# Patient Record
Sex: Male | Born: 1995 | Race: White | Hispanic: No | Marital: Single | Smoking: Current some day smoker
Health system: Southern US, Community
[De-identification: ages and names within clinical notes are randomized; demographics above are authoritative.]

## PROBLEM LIST (undated history)

## (undated) DIAGNOSIS — F419 Anxiety disorder, unspecified: Secondary | ICD-10-CM

## (undated) DIAGNOSIS — N200 Calculus of kidney: Secondary | ICD-10-CM

## (undated) DIAGNOSIS — K3580 Unspecified acute appendicitis: Secondary | ICD-10-CM

## (undated) HISTORY — DX: Unspecified acute appendicitis: K35.80

## (undated) HISTORY — DX: Calculus of kidney: N20.0

---
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
## 2017-01-01 DIAGNOSIS — J069 Acute upper respiratory infection, unspecified: Secondary | ICD-10-CM | POA: Diagnosis not present

## 2017-01-01 DIAGNOSIS — H6502 Acute serous otitis media, left ear: Secondary | ICD-10-CM | POA: Diagnosis not present

## 2017-02-10 ENCOUNTER — Encounter: Payer: 59 | Admitting: Family Medicine

## 2017-02-10 DIAGNOSIS — Z0289 Encounter for other administrative examinations: Secondary | ICD-10-CM

## 2017-02-19 DIAGNOSIS — H9312 Tinnitus, left ear: Secondary | ICD-10-CM | POA: Diagnosis not present

## 2017-02-19 DIAGNOSIS — Z68.41 Body mass index (BMI) pediatric, less than 5th percentile for age: Secondary | ICD-10-CM | POA: Diagnosis not present

## 2017-02-19 DIAGNOSIS — F418 Other specified anxiety disorders: Secondary | ICD-10-CM | POA: Diagnosis not present

## 2017-03-21 DIAGNOSIS — F418 Other specified anxiety disorders: Secondary | ICD-10-CM | POA: Diagnosis not present

## 2017-06-10 DIAGNOSIS — Z6820 Body mass index (BMI) 20.0-20.9, adult: Secondary | ICD-10-CM | POA: Diagnosis not present

## 2017-06-10 DIAGNOSIS — F418 Other specified anxiety disorders: Secondary | ICD-10-CM | POA: Diagnosis not present

## 2017-08-23 IMAGING — DX DG ABDOMEN 2V
3 series · 4 of 4 positions shown · non-contrast
Comparison: No in PACs

CLINICAL DATA: Right lower quadrant abdominal pain and tenderness
for the past day. History of chronic constipation most recent bowel
movement yesterday.

EXAM:
ABDOMEN - 2 VIEW

[Series 1: abdomen erect · 0.14mm/px · 2 of 2 slices shown]
[im 1/2]
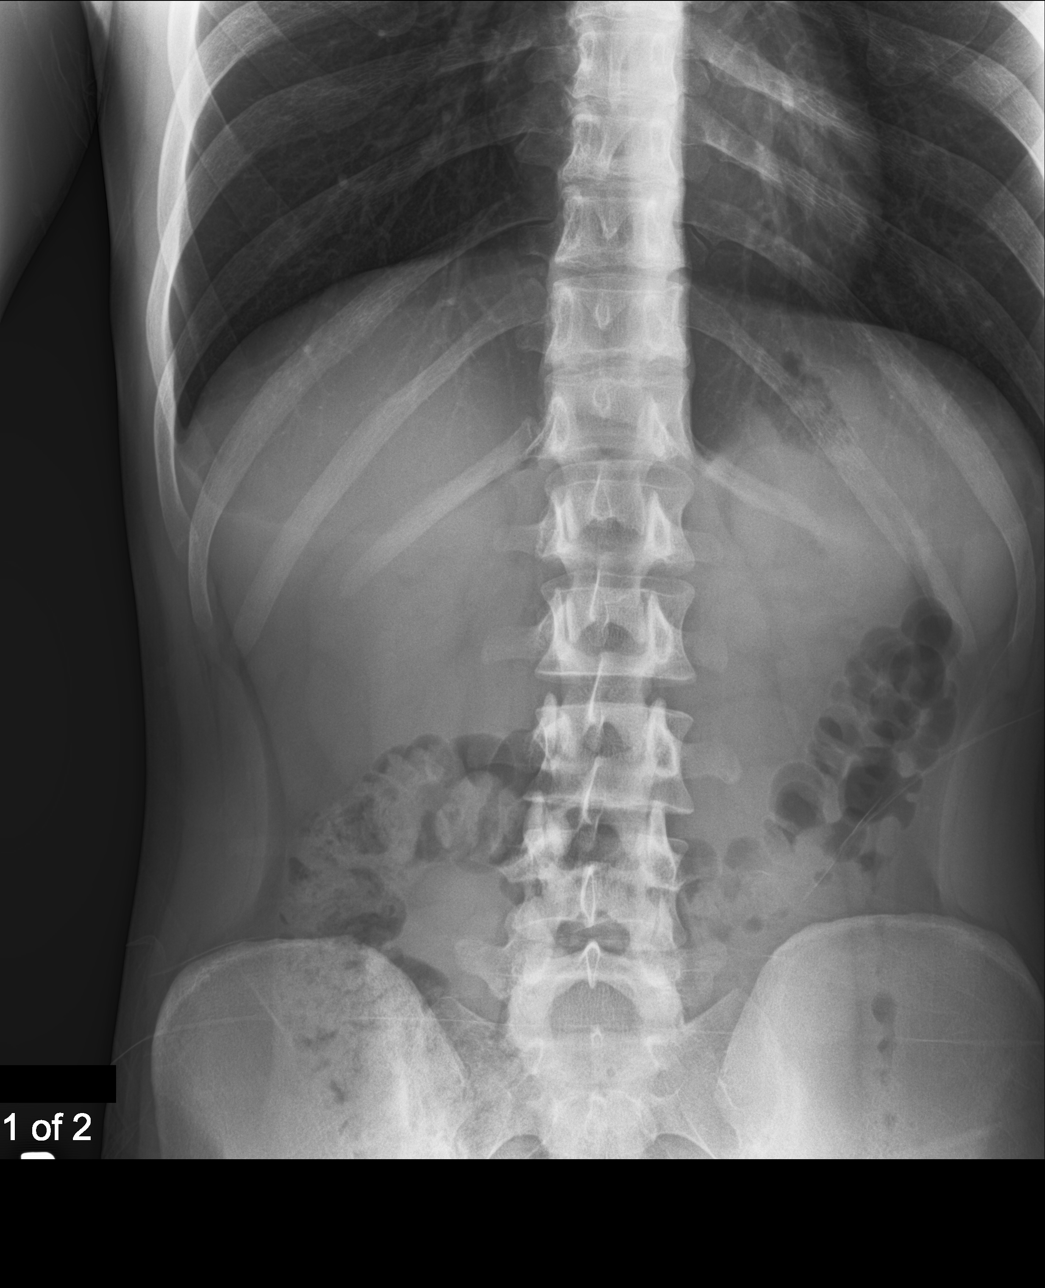
[im 2/2]
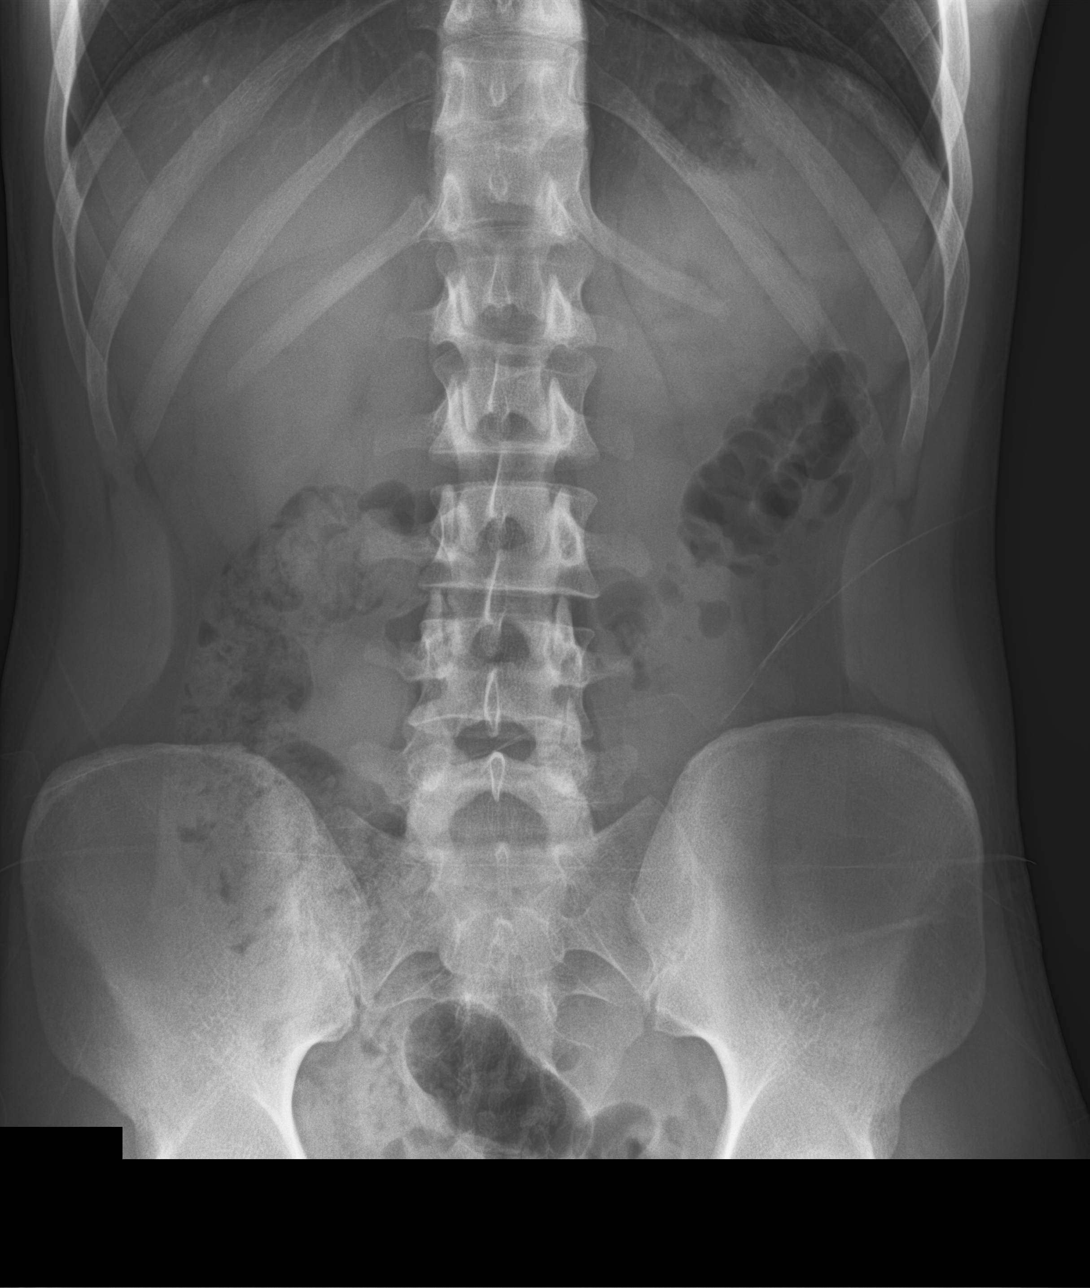

[abdomen supine (1 of 2)]
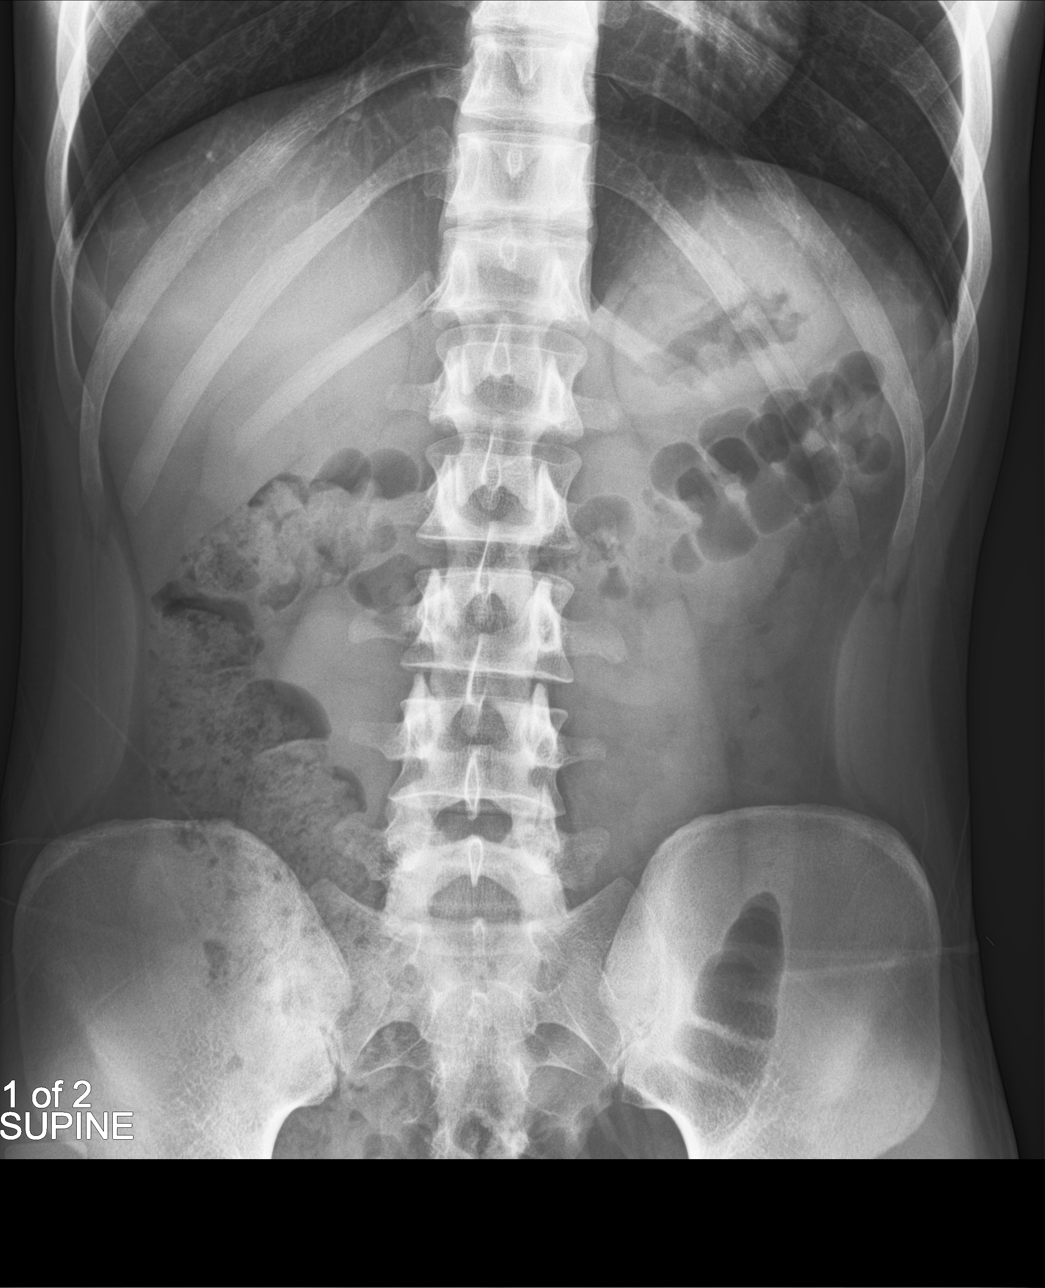

[abdomen supine (2 of 2)]
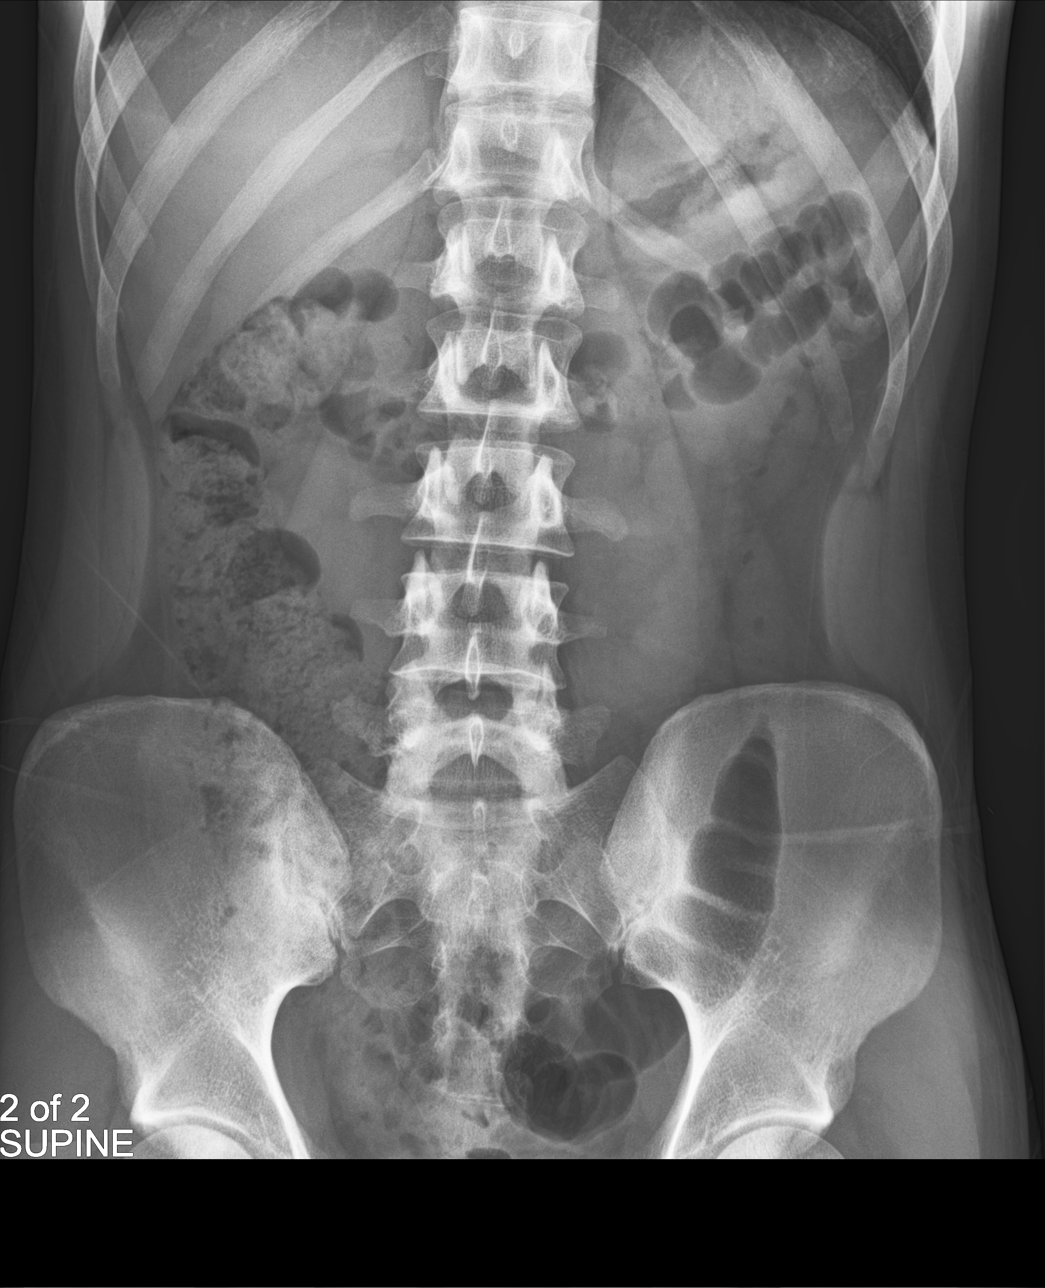

[4 of 4 positions shown; findings below may reference images not displayed]

FINDINGS: The colonic stool burden is mildly increased but less conspicuous
than on the previous study. No small or large bowel obstructive
pattern is observed. No abnormal soft tissue calcifications are
observed. The bony structures are unremarkable.
IMPRESSION: Moderate colonic stool burden may reflect mild constipation in the
appropriate clinical setting. There is no evidence of bowel
obstruction or perforation.

## 2017-09-04 DIAGNOSIS — W57XXXA Bitten or stung by nonvenomous insect and other nonvenomous arthropods, initial encounter: Secondary | ICD-10-CM | POA: Diagnosis not present

## 2017-09-04 DIAGNOSIS — R590 Localized enlarged lymph nodes: Secondary | ICD-10-CM | POA: Diagnosis not present

## 2017-09-23 DIAGNOSIS — Z113 Encounter for screening for infections with a predominantly sexual mode of transmission: Secondary | ICD-10-CM | POA: Diagnosis not present

## 2017-09-23 DIAGNOSIS — Z6820 Body mass index (BMI) 20.0-20.9, adult: Secondary | ICD-10-CM | POA: Diagnosis not present

## 2017-09-23 DIAGNOSIS — Z0000 Encounter for general adult medical examination without abnormal findings: Secondary | ICD-10-CM | POA: Diagnosis not present

## 2017-09-23 DIAGNOSIS — H9312 Tinnitus, left ear: Secondary | ICD-10-CM | POA: Diagnosis not present

## 2017-11-30 ENCOUNTER — Emergency Department (HOSPITAL_COMMUNITY): Payer: 59

## 2017-11-30 ENCOUNTER — Encounter (HOSPITAL_COMMUNITY): Payer: Self-pay | Admitting: Emergency Medicine

## 2017-11-30 ENCOUNTER — Inpatient Hospital Stay (HOSPITAL_COMMUNITY)
Admission: EM | Admit: 2017-11-30 | Discharge: 2017-12-01 | DRG: 343 | Disposition: A | Discharge status: Home / Self Care | Payer: 59 | Attending: General Surgery | Admitting: General Surgery

## 2017-11-30 ENCOUNTER — Other Ambulatory Visit: Payer: Self-pay

## 2017-11-30 DIAGNOSIS — R17 Unspecified jaundice: Secondary | ICD-10-CM | POA: Diagnosis not present

## 2017-11-30 DIAGNOSIS — K353 Acute appendicitis with localized peritonitis, without perforation or gangrene: Secondary | ICD-10-CM | POA: Diagnosis not present

## 2017-11-30 DIAGNOSIS — E876 Hypokalemia: Secondary | ICD-10-CM | POA: Diagnosis not present

## 2017-11-30 DIAGNOSIS — R1030 Lower abdominal pain, unspecified: Secondary | ICD-10-CM | POA: Diagnosis not present

## 2017-11-30 DIAGNOSIS — K3580 Unspecified acute appendicitis: Principal | ICD-10-CM | POA: Diagnosis present

## 2017-11-30 DIAGNOSIS — E869 Volume depletion, unspecified: Secondary | ICD-10-CM | POA: Diagnosis present

## 2017-11-30 DIAGNOSIS — R1031 Right lower quadrant pain: Secondary | ICD-10-CM | POA: Diagnosis not present

## 2017-11-30 LAB — COMPREHENSIVE METABOLIC PANEL
ALBUMIN: 5.1 g/dL — AB (ref 3.5–5.0)
ALT: 19 U/L (ref 0–44)
AST: 28 U/L (ref 15–41)
Alkaline Phosphatase: 70 U/L (ref 38–126)
Anion gap: 8 (ref 5–15)
BUN: 10 mg/dL (ref 6–20)
CHLORIDE: 105 mmol/L (ref 98–111)
CO2: 25 mmol/L (ref 22–32)
CREATININE: 0.94 mg/dL (ref 0.61–1.24)
Calcium: 9.3 mg/dL (ref 8.9–10.3)
GFR calc non Af Amer: >60 mL/min (ref >60)
Glucose, Bld: 82 mg/dL (ref 70–99)
Potassium: 3.4 mmol/L — ABNORMAL LOW (ref 3.5–5.1)
Sodium: 138 mmol/L (ref 135–145)
Total Bilirubin: 1.4 mg/dL — ABNORMAL HIGH (ref 0.3–1.2)
Total Protein: 8.4 g/dL — ABNORMAL HIGH (ref 6.5–8.1)

## 2017-11-30 LAB — URINALYSIS, ROUTINE W REFLEX MICROSCOPIC
Bilirubin Urine: NEGATIVE
Glucose, UA: NEGATIVE mg/dL
Hgb urine dipstick: NEGATIVE
KETONES UR: 5 mg/dL — AB
LEUKOCYTES UA: NEGATIVE
NITRITE: NEGATIVE
Protein, ur: NEGATIVE mg/dL
Specific Gravity, Urine: 1.023 (ref 1.005–1.030)
pH: 6 (ref 5.0–8.0)

## 2017-11-30 LAB — CBC WITH DIFFERENTIAL/PLATELET
ABS IMMATURE GRANULOCYTES: 0.02 10*3/uL (ref 0.00–0.07)
BASOS ABS: 0 10*3/uL (ref 0.0–0.1)
Basophils Relative: 0 %
Eosinophils Absolute: 0 10*3/uL (ref 0.0–0.5)
Eosinophils Relative: 0 %
HCT: 49.5 % (ref 39.0–52.0)
HEMOGLOBIN: 16.9 g/dL (ref 13.0–17.0)
Immature Granulocytes: 0 %
Lymphocytes Relative: 18 %
Lymphs Abs: 1.8 10*3/uL (ref 0.7–4.0)
MCH: 30.8 pg (ref 26.0–34.0)
MCHC: 34.1 g/dL (ref 30.0–36.0)
MCV: 90.3 fL (ref 80.0–100.0)
Monocytes Absolute: 0.6 10*3/uL (ref 0.1–1.0)
Monocytes Relative: 6 %
NEUTROS PCT: 76 %
NRBC: 0 % (ref 0.0–0.2)
Neutro Abs: 7.3 10*3/uL (ref 1.7–7.7)
Platelets: 250 10*3/uL (ref 150–400)
RBC: 5.48 MIL/uL (ref 4.22–5.81)
RDW: 12.6 % (ref 11.5–15.5)
WBC: 9.7 10*3/uL (ref 4.0–10.5)

## 2017-11-30 MED ORDER — SODIUM CHLORIDE 0.9 % IV BOLUS
500.0000 mL | Freq: Once | INTRAVENOUS | Status: AC
  Administered 2017-11-30: 500 mL via INTRAVENOUS

## 2017-11-30 MED ORDER — HYDROMORPHONE HCL 1 MG/ML IJ SOLN
0.5000 mg | Freq: Once | INTRAMUSCULAR | Status: AC
  Administered 2017-11-30: 0.5 mg via INTRAVENOUS
  Filled 2017-11-30: qty 1

## 2017-11-30 MED ORDER — ONDANSETRON HCL 4 MG/2ML IJ SOLN
4.0000 mg | Freq: Once | INTRAMUSCULAR | Status: AC
  Administered 2017-11-30: 4 mg via INTRAVENOUS
  Filled 2017-11-30: qty 2

## 2017-11-30 NOTE — ED Triage Notes (Signed)
Pt started with generalized lower abdominal and back pain on Saturday. Now c/o RLQ abdominal pain only. Denies fevers,nausea,vomiting,and diarrhea.

## 2017-11-30 NOTE — ED Provider Notes (Signed)
[facility] EMERGENCY DEPARTMENT Provider Note   CSN: [id number] Arrival date & time: 11/30/17  2014     History   Chief Complaint Chief Complaint  Patient presents with  . Abdominal Pain    HPI Matthew Mullins is a 22 y.o. male.  Patient complains of right lower quadrant abdominal pain that has been coming increasingly worse over the last couple days no fevers no chills no vomiting  The history is provided by the patient. No language interpreter was used.  Abdominal Pain   This is a new problem. The current episode started 2 days ago. The problem occurs constantly. The problem has not changed since onset.The pain is associated with an unknown factor. The pain is located in the RLQ. The quality of the pain is aching. The pain is at a severity of 5/10. The pain is moderate. Pertinent negatives include diarrhea, flatus, frequency, hematuria and headaches. Nothing aggravates the symptoms.    Past Medical History:  Diagnosis Date  . Nephrolithiasis 11/2016   ER visit referred to urology    Patient Active Problem List   Diagnosis Date Noted  . Constipation 09/12/2016  . Viral pharyngitis 09/12/2016  . Well adolescent visit 06/24/2012    Past Surgical History:  Procedure Laterality Date  . NO PAST SURGERIES          Home Medications    Prior to Admission medications   Not on File    Family History Family History  Problem Relation Age of Onset  . Arrhythmia Paternal Grandmother        prolonged QT  . Hypertension Paternal Grandmother   . Diabetes Paternal Grandmother   . COPD Paternal Grandfather   . CAD Other        great grandmother  . Stroke Other        great grandmother  . Cancer Other        paternal aunt with leukemia and breast    Social History Social History   Tobacco Use  . Smoking status: Never Smoker  . Smokeless tobacco: Never Used  Substance Use Topics  . Alcohol use: No  . Drug use: No     Allergies   Patient has no known  allergies.   Review of Systems Review of Systems  Constitutional: Negative for appetite change and fatigue.  HENT: Negative for congestion, ear discharge and sinus pressure.   Eyes: Negative for discharge.  Respiratory: Negative for cough.   Cardiovascular: Negative for chest pain.  Gastrointestinal: Positive for abdominal pain. Negative for diarrhea and flatus.  Genitourinary: Negative for frequency and hematuria.  Musculoskeletal: Negative for back pain.  Skin: Negative for rash.  Neurological: Negative for seizures and headaches.  Psychiatric/Behavioral: Negative for hallucinations.     Physical Exam Updated Vital Signs BP 111/67   Pulse (!) 52   Temp 98.1 F (36.7 C) (Oral)   Resp 16   Ht 5\' 5"  (1.651 m)   Wt 56.7 kg   SpO2 97%   BMI 20.80 kg/m   Physical Exam  Constitutional: He is oriented to person, place, and time. He appears well-developed.  HENT:  Head: Normocephalic.  Eyes: Conjunctivae and EOM are normal. No scleral icterus.  Neck: Neck supple. No thyromegaly present.  Cardiovascular: Normal rate and regular rhythm. Exam reveals no gallop and no friction rub.  No murmur heard. Pulmonary/Chest: No stridor. He has no wheezes. He has no rales. He exhibits no tenderness.  Abdominal: He exhibits no distension. There is tenderness. There  is no rebound.  Tender right lower quadrant.  No rebound  Musculoskeletal: Normal range of motion. He exhibits no edema.  Lymphadenopathy:    He has no cervical adenopathy.  Neurological: He is oriented to person, place, and time. He exhibits normal muscle tone. Coordination normal.  Skin: No rash noted. No erythema.  Psychiatric: He has a normal mood and affect. His behavior is normal.     ED Treatments / Results  Labs (all labs ordered are listed, but only abnormal results are displayed) Labs Reviewed  URINALYSIS, ROUTINE W REFLEX MICROSCOPIC - Abnormal; Notable for the following components:      Result Value    Ketones, ur 5 (*)    All other components within normal limits  COMPREHENSIVE METABOLIC PANEL - Abnormal; Notable for the following components:   Potassium 3.4 (*)    Total Protein 8.4 (*)    Albumin 5.1 (*)    Total Bilirubin 1.4 (*)    All other components within normal limits  CBC WITH DIFFERENTIAL/PLATELET    EKG None  Radiology No results found.  Procedures Procedures (including critical care time)  Medications Ordered in ED Medications  sodium chloride 0.9 % bolus 500 mL (0 mLs Intravenous Stopped 11/30/17 2334)  ondansetron (ZOFRAN) injection 4 mg (4 mg Intravenous Given 11/30/17 2241)  HYDROmorphone (DILAUDID) injection 0.5 mg (0.5 mg Intravenous Given 11/30/17 2241)     Initial Impression / Assessment and Plan / ED Course  I have reviewed the triage vital signs and the nursing notes.  Pertinent labs & imaging results that were available during my care of the patient were reviewed by me and considered in my medical decision making (see chart for details).     Labs unremarkable.  Patient with right lower quadrant abdominal pain.  He will get a CT scan of the abdomen to rule out appendicitis  Final Clinical Impressions(s) / ED Diagnoses   Final diagnoses:  None    ED Discharge Orders    None       Bethann BerkshireZammit, Kariana Wiles, MD 11/30/17 2342

## 2017-12-01 ENCOUNTER — Encounter (HOSPITAL_COMMUNITY): Payer: Self-pay | Admitting: Internal Medicine

## 2017-12-01 ENCOUNTER — Inpatient Hospital Stay (HOSPITAL_COMMUNITY): Payer: 59 | Admitting: Anesthesiology

## 2017-12-01 ENCOUNTER — Encounter (HOSPITAL_COMMUNITY)
Admission: EM | Disposition: A | Discharge status: Home / Self Care | Payer: Self-pay | Source: Home / Self Care | Attending: General Surgery

## 2017-12-01 DIAGNOSIS — E876 Hypokalemia: Secondary | ICD-10-CM | POA: Diagnosis present

## 2017-12-01 DIAGNOSIS — K353 Acute appendicitis with localized peritonitis, without perforation or gangrene: Secondary | ICD-10-CM

## 2017-12-01 DIAGNOSIS — K3580 Unspecified acute appendicitis: Secondary | ICD-10-CM | POA: Diagnosis not present

## 2017-12-01 DIAGNOSIS — R1030 Lower abdominal pain, unspecified: Secondary | ICD-10-CM | POA: Diagnosis present

## 2017-12-01 DIAGNOSIS — R17 Unspecified jaundice: Secondary | ICD-10-CM | POA: Diagnosis present

## 2017-12-01 DIAGNOSIS — E869 Volume depletion, unspecified: Secondary | ICD-10-CM | POA: Diagnosis present

## 2017-12-01 HISTORY — PX: LAPAROSCOPIC APPENDECTOMY: SHX408

## 2017-12-01 HISTORY — DX: Unspecified acute appendicitis: K35.80

## 2017-12-01 LAB — COMPREHENSIVE METABOLIC PANEL
ALBUMIN: 4.3 g/dL (ref 3.5–5.0)
ALK PHOS: 60 U/L (ref 38–126)
ALT: 16 U/L (ref 0–44)
ANION GAP: 6 (ref 5–15)
AST: 21 U/L (ref 15–41)
BUN: 10 mg/dL (ref 6–20)
CALCIUM: 8.9 mg/dL (ref 8.9–10.3)
CHLORIDE: 109 mmol/L (ref 98–111)
CO2: 25 mmol/L (ref 22–32)
Creatinine, Ser: 1.03 mg/dL (ref 0.61–1.24)
GFR calc non Af Amer: >60 mL/min (ref >60)
Glucose, Bld: 82 mg/dL (ref 70–99)
POTASSIUM: 4.1 mmol/L (ref 3.5–5.1)
SODIUM: 140 mmol/L (ref 135–145)
Total Bilirubin: 1.5 mg/dL — ABNORMAL HIGH (ref 0.3–1.2)
Total Protein: 7.3 g/dL (ref 6.5–8.1)

## 2017-12-01 LAB — BILIRUBIN, FRACTIONATED(TOT/DIR/INDIR)
Bilirubin, Direct: 0.2 mg/dL (ref 0.0–0.2)
Indirect Bilirubin: 1.4 mg/dL — ABNORMAL HIGH (ref 0.3–0.9)
Total Bilirubin: 1.6 mg/dL — ABNORMAL HIGH (ref 0.3–1.2)

## 2017-12-01 LAB — CBC
HEMATOCRIT: 44.8 % (ref 39.0–52.0)
HEMOGLOBIN: 15.1 g/dL (ref 13.0–17.0)
MCH: 31.2 pg (ref 26.0–34.0)
MCHC: 33.7 g/dL (ref 30.0–36.0)
MCV: 92.6 fL (ref 80.0–100.0)
NRBC: 0 % (ref 0.0–0.2)
PLATELETS: 232 10*3/uL (ref 150–400)
RBC: 4.84 MIL/uL (ref 4.22–5.81)
RDW: 12.8 % (ref 11.5–15.5)
WBC: 8.4 10*3/uL (ref 4.0–10.5)

## 2017-12-01 LAB — MAGNESIUM: MAGNESIUM: 2 mg/dL (ref 1.7–2.4)

## 2017-12-01 LAB — PHOSPHORUS: Phosphorus: 3.8 mg/dL (ref 2.5–4.6)

## 2017-12-01 SURGERY — APPENDECTOMY, LAPAROSCOPIC
Anesthesia: General

## 2017-12-01 MED ORDER — POVIDONE-IODINE 10 % EX OINT
TOPICAL_OINTMENT | CUTANEOUS | Status: AC
  Filled 2017-12-01: qty 1

## 2017-12-01 MED ORDER — ACETAMINOPHEN 325 MG PO TABS: 650.0000 mg | ORAL_TABLET | Freq: Four times a day (QID) | ORAL | Status: DC | PRN

## 2017-12-01 MED ORDER — ACETAMINOPHEN 650 MG RE SUPP: 650.0000 mg | Freq: Four times a day (QID) | RECTAL | Status: DC | PRN

## 2017-12-01 MED ORDER — FENTANYL CITRATE (PF) 100 MCG/2ML IJ SOLN
INTRAMUSCULAR | Status: DC | PRN
  Administered 2017-12-01: 50 ug via INTRAVENOUS
  Administered 2017-12-01: 100 ug via INTRAVENOUS

## 2017-12-01 MED ORDER — HEMOSTATIC AGENTS (NO CHARGE) OPTIME
TOPICAL | Status: DC | PRN
  Administered 2017-12-01: 1 via TOPICAL

## 2017-12-01 MED ORDER — FENTANYL CITRATE (PF) 250 MCG/5ML IJ SOLN
INTRAMUSCULAR | Status: AC
  Filled 2017-12-01: qty 5

## 2017-12-01 MED ORDER — KETOROLAC TROMETHAMINE 30 MG/ML IJ SOLN
30.0000 mg | Freq: Once | INTRAMUSCULAR | Status: AC
  Administered 2017-12-01: 30 mg via INTRAVENOUS
  Filled 2017-12-01: qty 1

## 2017-12-01 MED ORDER — POVIDONE-IODINE 10 % OINT PACKET
TOPICAL_OINTMENT | CUTANEOUS | Status: DC | PRN
  Administered 2017-12-01: 1 via TOPICAL

## 2017-12-01 MED ORDER — PIPERACILLIN-TAZOBACTAM 3.375 G IVPB 30 MIN
3.3750 g | Freq: Once | INTRAVENOUS | Status: AC
  Administered 2017-12-01: 3.375 g via INTRAVENOUS
  Filled 2017-12-01: qty 50

## 2017-12-01 MED ORDER — SODIUM CHLORIDE 0.9 % IR SOLN
Status: DC | PRN
  Administered 2017-12-01: 1000 mL

## 2017-12-01 MED ORDER — ONDANSETRON HCL 4 MG/2ML IJ SOLN
INTRAMUSCULAR | Status: AC
  Filled 2017-12-01: qty 2

## 2017-12-01 MED ORDER — BUPIVACAINE LIPOSOME 1.3 % IJ SUSP
INTRAMUSCULAR | Status: DC | PRN
  Administered 2017-12-01: 20 mL

## 2017-12-01 MED ORDER — PROPOFOL 10 MG/ML IV BOLUS
INTRAVENOUS | Status: DC | PRN
  Administered 2017-12-01: 150 mg via INTRAVENOUS
  Administered 2017-12-01: 50 mg via INTRAVENOUS

## 2017-12-01 MED ORDER — MEPERIDINE HCL 50 MG/ML IJ SOLN: 6.2500 mg | INTRAMUSCULAR | Status: DC | PRN

## 2017-12-01 MED ORDER — ROCURONIUM BROMIDE 10 MG/ML (PF) SYRINGE
PREFILLED_SYRINGE | INTRAVENOUS | Status: AC
  Filled 2017-12-01: qty 10

## 2017-12-01 MED ORDER — KETOROLAC TROMETHAMINE 30 MG/ML IJ SOLN: 30.0000 mg | Freq: Once | INTRAMUSCULAR | Status: DC

## 2017-12-01 MED ORDER — ONDANSETRON HCL 4 MG PO TABS: 4.0000 mg | ORAL_TABLET | Freq: Four times a day (QID) | ORAL | Status: DC | PRN

## 2017-12-01 MED ORDER — ONDANSETRON HCL 4 MG/2ML IJ SOLN: 4.0000 mg | Freq: Once | INTRAMUSCULAR | Status: DC | PRN

## 2017-12-01 MED ORDER — FAMOTIDINE IN NACL 20-0.9 MG/50ML-% IV SOLN
20.0000 mg | Freq: Two times a day (BID) | INTRAVENOUS | Status: DC
  Administered 2017-12-01: 20 mg via INTRAVENOUS
  Filled 2017-12-01: qty 50

## 2017-12-01 MED ORDER — HYDROCODONE-ACETAMINOPHEN 7.5-325 MG PO TABS: 1.0000 | ORAL_TABLET | Freq: Once | ORAL | Status: DC | PRN

## 2017-12-01 MED ORDER — HYDROCODONE-ACETAMINOPHEN 5-325 MG PO TABS: 1.0000 | ORAL_TABLET | ORAL | 0 refills | Status: DC | PRN

## 2017-12-01 MED ORDER — HYDROMORPHONE HCL 1 MG/ML IJ SOLN: 1.0000 mg | INTRAMUSCULAR | Status: DC | PRN

## 2017-12-01 MED ORDER — SODIUM CHLORIDE 0.9 % IV SOLN: 100.0000 mg/kg | Freq: Three times a day (TID) | INTRAVENOUS | Status: DC

## 2017-12-01 MED ORDER — LACTATED RINGERS IV SOLN
INTRAVENOUS | Status: DC
  Administered 2017-12-01 (×2): via INTRAVENOUS

## 2017-12-01 MED ORDER — ONDANSETRON HCL 4 MG/2ML IJ SOLN
4.0000 mg | Freq: Four times a day (QID) | INTRAMUSCULAR | Status: DC | PRN
  Administered 2017-12-01: 4 mg via INTRAVENOUS

## 2017-12-01 MED ORDER — BUPIVACAINE LIPOSOME 1.3 % IJ SUSP
INTRAMUSCULAR | Status: AC
  Filled 2017-12-01: qty 20

## 2017-12-01 MED ORDER — SODIUM CHLORIDE 0.9 % IV SOLN: Freq: Once | INTRAVENOUS | Status: DC

## 2017-12-01 MED ORDER — CHLORHEXIDINE GLUCONATE CLOTH 2 % EX PADS: 6.0000 | MEDICATED_PAD | Freq: Once | CUTANEOUS | Status: DC

## 2017-12-01 MED ORDER — KETOROLAC TROMETHAMINE 30 MG/ML IJ SOLN: 30.0000 mg | Freq: Once | INTRAMUSCULAR | Status: DC | PRN

## 2017-12-01 MED ORDER — SUGAMMADEX SODIUM 500 MG/5ML IV SOLN
INTRAVENOUS | Status: AC
  Filled 2017-12-01: qty 5

## 2017-12-01 MED ORDER — LACTATED RINGERS IV BOLUS: 1000.0000 mL | Freq: Once | INTRAVENOUS | Status: DC

## 2017-12-01 MED ORDER — HYDROMORPHONE HCL 1 MG/ML IJ SOLN: 0.2500 mg | INTRAMUSCULAR | Status: DC | PRN

## 2017-12-01 MED ORDER — PIPERACILLIN-TAZOBACTAM 3.375 G IVPB 30 MIN
3.3750 g | Freq: Once | INTRAVENOUS | Status: DC
  Filled 2017-12-01: qty 50

## 2017-12-01 MED ORDER — SUGAMMADEX SODIUM 200 MG/2ML IV SOLN
INTRAVENOUS | Status: DC | PRN
  Administered 2017-12-01: 200 mg via INTRAVENOUS

## 2017-12-01 MED ORDER — ROCURONIUM 10MG/ML (10ML) SYRINGE FOR MEDFUSION PUMP - OPTIME
INTRAVENOUS | Status: DC | PRN
  Administered 2017-12-01: 40 mg via INTRAVENOUS

## 2017-12-01 MED ORDER — MIDAZOLAM HCL 5 MG/5ML IJ SOLN
INTRAMUSCULAR | Status: DC | PRN
  Administered 2017-12-01: 2 mg via INTRAVENOUS

## 2017-12-01 MED ORDER — POTASSIUM CHLORIDE IN NACL 20-0.9 MEQ/L-% IV SOLN
INTRAVENOUS | Status: AC
  Administered 2017-12-01: 02:00:00 via INTRAVENOUS
  Filled 2017-12-01: qty 1000

## 2017-12-01 MED ORDER — PIPERACILLIN-TAZOBACTAM 3.375 G IVPB: 3.3750 g | Freq: Three times a day (TID) | INTRAVENOUS | Status: DC

## 2017-12-01 MED ORDER — EPHEDRINE 5 MG/ML INJ
INTRAVENOUS | Status: AC
  Filled 2017-12-01: qty 10

## 2017-12-01 MED ORDER — POTASSIUM CHLORIDE IN NACL 20-0.9 MEQ/L-% IV SOLN
INTRAVENOUS | Status: DC
  Administered 2017-12-01: 06:00:00 via INTRAVENOUS
  Filled 2017-12-01: qty 1000

## 2017-12-01 MED ORDER — IOPAMIDOL (ISOVUE-300) INJECTION 61%
100.0000 mL | Freq: Once | INTRAVENOUS | Status: AC | PRN
  Administered 2017-12-01: 100 mL via INTRAVENOUS

## 2017-12-01 MED ORDER — LIDOCAINE 2% (20 MG/ML) 5 ML SYRINGE
INTRAMUSCULAR | Status: AC
  Filled 2017-12-01: qty 5

## 2017-12-01 MED ORDER — MIDAZOLAM HCL 2 MG/2ML IJ SOLN
INTRAMUSCULAR | Status: AC
  Filled 2017-12-01: qty 2

## 2017-12-01 SURGICAL SUPPLY — 50 items
APPLICATOR ARISTA FLEXITIP XL (MISCELLANEOUS) ×3 IMPLANT
BAG RETRIEVAL 10 (BASKET) ×1
BAG RETRIEVAL 10MM (BASKET) ×1
CHLORAPREP W/TINT 26ML (MISCELLANEOUS) ×3 IMPLANT
CLOTH BEACON ORANGE TIMEOUT ST (SAFETY) ×3 IMPLANT
COVER LIGHT HANDLE STERIS (MISCELLANEOUS) ×6 IMPLANT
CUTTER FLEX LINEAR 45M (STAPLE) ×3 IMPLANT
DECANTER SPIKE VIAL GLASS SM (MISCELLANEOUS) ×3 IMPLANT
ELECT REM PT RETURN 9FT ADLT (ELECTROSURGICAL) ×3
ELECTRODE REM PT RTRN 9FT ADLT (ELECTROSURGICAL) ×1 IMPLANT
EVACUATOR SMOKE 8.L (FILTER) ×3 IMPLANT
GLOVE BIOGEL PI IND STRL 6.5 (GLOVE) ×1 IMPLANT
GLOVE BIOGEL PI IND STRL 7.0 (GLOVE) ×2 IMPLANT
GLOVE BIOGEL PI INDICATOR 6.5 (GLOVE) ×2
GLOVE BIOGEL PI INDICATOR 7.0 (GLOVE) ×4
GLOVE SURG SS PI 6.5 STRL IVOR (GLOVE) ×3 IMPLANT
GLOVE SURG SS PI 7.5 STRL IVOR (GLOVE) ×3 IMPLANT
GOWN STRL REUS W/ TWL XL LVL3 (GOWN DISPOSABLE) ×1 IMPLANT
GOWN STRL REUS W/TWL LRG LVL3 (GOWN DISPOSABLE) ×3 IMPLANT
GOWN STRL REUS W/TWL XL LVL3 (GOWN DISPOSABLE) ×2
HEMOSTAT ARISTA ABSORB 1G (MISCELLANEOUS) ×3 IMPLANT
INST SET LAPROSCOPIC AP (KITS) ×3 IMPLANT
KIT TURNOVER KIT A (KITS) ×3 IMPLANT
MANIFOLD NEPTUNE II (INSTRUMENTS) ×3 IMPLANT
NEEDLE HYPO 18GX1.5 BLUNT FILL (NEEDLE) ×3 IMPLANT
NEEDLE HYPO 22GX1.5 SAFETY (NEEDLE) ×3 IMPLANT
NEEDLE INSUFFLATION 14GA 120MM (NEEDLE) ×3 IMPLANT
NS IRRIG 1000ML POUR BTL (IV SOLUTION) ×3 IMPLANT
PACK LAP CHOLE LZT030E (CUSTOM PROCEDURE TRAY) ×3 IMPLANT
PAD ARMBOARD 7.5X6 YLW CONV (MISCELLANEOUS) ×3 IMPLANT
PENCIL HANDSWITCHING (ELECTRODE) ×3 IMPLANT
RELOAD 45 VASCULAR/THIN (ENDOMECHANICALS) ×3 IMPLANT
SET BASIN LINEN APH (SET/KITS/TRAYS/PACK) ×3 IMPLANT
SHEARS HARMONIC ACE PLUS 36CM (ENDOMECHANICALS) ×3 IMPLANT
SPONGE GAUZE 2X2 8PLY STER LF (GAUZE/BANDAGES/DRESSINGS) ×3
SPONGE GAUZE 2X2 8PLY STRL LF (GAUZE/BANDAGES/DRESSINGS) ×6 IMPLANT
STAPLER VISISTAT (STAPLE) ×3 IMPLANT
SUT VICRYL 0 UR6 27IN ABS (SUTURE) ×3 IMPLANT
SYR 20CC LL (SYRINGE) ×6 IMPLANT
SYR 30ML LL (SYRINGE) ×3 IMPLANT
SYS BAG RETRIEVAL 10MM (BASKET) ×1
SYSTEM BAG RETRIEVAL 10MM (BASKET) ×1 IMPLANT
TRAY FOLEY W/BAG SLVR 16FR (SET/KITS/TRAYS/PACK) ×2
TRAY FOLEY W/BAG SLVR 16FR ST (SET/KITS/TRAYS/PACK) ×1 IMPLANT
TROCAR ENDO BLADELESS 11MM (ENDOMECHANICALS) ×3 IMPLANT
TROCAR ENDO BLADELESS 12MM (ENDOMECHANICALS) ×3 IMPLANT
TROCAR XCEL NON-BLD 5MMX100MML (ENDOMECHANICALS) ×3 IMPLANT
TUBING INSUFFLATION (TUBING) ×3 IMPLANT
WARMER LAPAROSCOPE (MISCELLANEOUS) ×3 IMPLANT
YANKAUER SUCT 12FT TUBE ARGYLE (SUCTIONS) ×3 IMPLANT

## 2017-12-01 NOTE — ED Notes (Signed)
Zosyn and 0.9% NaCl with Kcl 20 Meq/L will be started after Pepcid is complete as they are not compatible.

## 2017-12-01 NOTE — Discharge Summary (Signed)
Physician Discharge Summary  Patient ID: Matthew Mullins MRN: [id number] DOB/AGE: April 13, 1995 22 y.o.  Admit date: 11/30/2017 Discharge date: 12/01/2017  Admission Diagnoses: Acute appendicitis  Discharge Diagnoses: Same Principal Problem:   Acute appendicitis Active Problems:   Hypokalemia   Elevated bilirubin   Discharged Condition: good  Hospital Course: Patient is a 22 year old white male who presented with a 24-hour history of worsening right lower quadrant abdominal pain.  CT scan of the abdomen revealed acute appendicitis.  He underwent laparoscopic appendectomy on 12/01/2017.  He tolerated the procedure well.  His postoperative course was unremarkable.  He was discharged home from the PACU in good and stable condition.  Treatments: surgery: Laparoscopic appendectomy on 12/01/2017  Discharge Exam: Blood pressure 116/61, pulse 64, temperature 97.9 F (36.6 C), temperature source Oral, resp. rate 16, height 5\' 5"  (1.651 m), weight 56.7 kg, SpO2 100 %. General appearance: alert, cooperative and no distress Resp: clear to auscultation bilaterally Cardio: regular rate and rhythm, S1, S2 normal, no murmur, click, rub or gallop GI: Soft, dressing is dry and intact.  Disposition: Discharge disposition: 01-Home or Self Care       Discharge Instructions    Diet general   Complete by:  As directed    Increase activity slowly   Complete by:  As directed      Allergies as of 12/01/2017   No Known Allergies     Medication List    TAKE these medications   HYDROcodone-acetaminophen 5-325 MG tablet Commonly known as:  NORCO/VICODIN Take 1 tablet by mouth every 4 (four) hours as needed for moderate pain.      Follow-up Information    Franky MachoJenkins, Uday Jantz, MD. Schedule an appointment as soon as possible for a visit on 12/09/2017.   Specialty:  General Surgery Contact information: [address] [place] [id number] [phone]           Signed: Franky MachoMark  Mcguire Gasparyan 12/01/2017, 3:01 PM

## 2017-12-01 NOTE — H&P (Signed)
History and Physical    Matthew Mullins ZOX:[id number] DOB: 1995-10-03 DOA: 11/30/2017  PCP: [facility]   Patient coming from: Home.  I have personally briefly reviewed patient's old medical records in [facility]  Chief Complaint: Abdominal pain.  HPI: Matthew Mullins is a 22 y.o. male with medical history significant of nephrolithiasis who is coming to the emergency department with abdominal pain since about midnight from Saturday to yesterday Sunday, 11/30/2017.  He does not remember exactly what he ate last.  He denies fever, chills, nausea, emesis, diarrhea or constipation.  No dysuria, frequency or hematuria.  He denies sore throat, dyspnea, wheezing, hemoptysis, chest pain, palpitations, diaphoresis, PND, orthopnea or pitting edema of the lower extremities.  No polyuria, polydipsia, polyphagia or blurred vision.  No heat or cold intolerance.  ED Course: Initial vital signs temperature 98.1 F, pulse 84, respirations 17, blood pressure 131/87 mmHg and O2 sat 100% on room air.  The patient was given IV fluids, analgesics and Zosyn in the emergency department.  Dr. Rhunette Croft spoke to Dr. Lovell Sheehan who will be seeing the patient later today.  Urinalysis is normal.  CBC shows a normal white count of 9.7, hemoglobin 16.9 g/dL and platelets 811.  CMP shows a potassium of 3.4 mmol/L.  The rest of the electrolytes are within normal limits.  Renal function is normal.  Total protein is 8.4 and albumin 5.1 g/dL and total bilirubin is 1.4 mg/dL.  Alkaline phosphatase and transaminases are normal.  Magnesium and phosphorus are normal.  Imaging: CT abdomen/pelvis with contrast showed acute appendicitis without extraluminal gas or abscess.  There is appendiceal wall discontinuity in the mid to distal portion that may represent early appendiceal rupture.  See images and full radiology report for further detail.  Review of Systems: As per HPI otherwise 10 point review of systems  negative.   Past Medical History:  Diagnosis Date  . Nephrolithiasis 11/2016   ER visit referred to urology    Past Surgical History:  Procedure Laterality Date  . NO PAST SURGERIES       reports that he has never smoked. He has never used smokeless tobacco. He reports that he does not drink alcohol or use drugs.  No Known Allergies  Family History  Problem Relation Age of Onset  . Arrhythmia Paternal Grandmother        prolonged QT  . Hypertension Paternal Grandmother   . Diabetes Paternal Grandmother   . COPD Paternal Grandfather   . CAD Other        great grandmother  . Stroke Other        great grandmother  . Cancer Other        paternal aunt with leukemia and breast   Prior to Admission medications   Not on File    Physical Exam: Vitals:   12/01/17 0054 12/01/17 0100 12/01/17 0130 12/01/17 0230  BP: (!) 99/51 119/60 (!) 114/52 (!) 104/52  Pulse: 79 62 62 (!) 50  Resp: 16 17 17 18   Temp:      TempSrc:      SpO2: 98% 100% 100% 97%  Weight:      Height:        Constitutional: NAD, calm, comfortable Eyes: PERRL, lids and conjunctivae normal ENMT: Mucous membranes are mildly dry. Posterior pharynx clear of any exudate or lesions. Neck: normal, supple, no masses, no thyromegaly Respiratory: clear to auscultation bilaterally, no wheezing, no crackles. Normal respiratory effort. No accessory muscle  use.  Cardiovascular: Regular rate and rhythm, no murmurs / rubs / gallops. No extremity edema. 2+ pedal pulses. No carotid bruits.  Abdomen: Soft, positive RLQ tenderness, mild guarding, no rebound, no masses palpated. No hepatosplenomegaly. Bowel sounds positive.  Musculoskeletal: no clubbing / cyanosis. Good ROM, no contractures. Normal muscle tone.  Skin: no rashes, lesions, ulcers limited dermatological examination. Neurologic: CN 2-12 grossly intact. Sensation intact, DTR normal. Strength 5/5 in all 4.  Psychiatric: Normal judgment and insight. Alert and  oriented x 4.  Normal mood.   Labs on Admission: I have personally reviewed following labs and imaging studies  CBC: Recent Labs  Lab 11/30/17 2237  WBC 9.7  NEUTROABS 7.3  HGB 16.9  HCT 49.5  MCV 90.3  PLT 250   Basic Metabolic Panel: Recent Labs  Lab 11/30/17 2237  NA 138  K 3.4*  CL 105  CO2 25  GLUCOSE 82  BUN 10  CREATININE 0.94  CALCIUM 9.3  MG 2.0  PHOS 3.8   GFR: Estimated Creatinine Clearance: 100.5 mL/min (by C-G formula based on SCr of 0.94 mg/dL). Liver Function Tests: Recent Labs  Lab 11/30/17 2237  AST 28  ALT 19  ALKPHOS 70  BILITOT 1.4*  PROT 8.4*  ALBUMIN 5.1*   No results for input(s): LIPASE, AMYLASE in the last 168 hours. No results for input(s): AMMONIA in the last 168 hours. Coagulation Profile: No results for input(s): INR, PROTIME in the last 168 hours. Cardiac Enzymes: No results for input(s): CKTOTAL, CKMB, CKMBINDEX, TROPONINI in the last 168 hours. BNP (last 3 results) No results for input(s): PROBNP in the last 8760 hours. HbA1C: No results for input(s): HGBA1C in the last 72 hours. CBG: No results for input(s): GLUCAP in the last 168 hours. Lipid Profile: No results for input(s): CHOL, HDL, LDLCALC, TRIG, CHOLHDL, LDLDIRECT in the last 72 hours. Thyroid Function Tests: No results for input(s): TSH, T4TOTAL, FREET4, T3FREE, THYROIDAB in the last 72 hours. Anemia Panel: No results for input(s): VITAMINB12, FOLATE, FERRITIN, TIBC, IRON, RETICCTPCT in the last 72 hours. Urine analysis:    Component Value Date/Time   COLORURINE YELLOW 11/30/2017 2110   APPEARANCEUR CLEAR 11/30/2017 2110   LABSPEC 1.023 11/30/2017 2110   PHURINE 6.0 11/30/2017 2110   GLUCOSEU NEGATIVE 11/30/2017 2110   HGBUR NEGATIVE 11/30/2017 2110   BILIRUBINUR NEGATIVE 11/30/2017 2110   KETONESUR 5 (A) 11/30/2017 2110   PROTEINUR NEGATIVE 11/30/2017 2110   NITRITE NEGATIVE 11/30/2017 2110   LEUKOCYTESUR NEGATIVE 11/30/2017 2110    Radiological  Exams on Admission: Ct Abdomen Pelvis W Contrast  Result Date: 12/01/2017 CLINICAL DATA:  Abdominal pain and distension. Right lower quadrant pain. EXAM: CT ABDOMEN AND PELVIS WITH CONTRAST TECHNIQUE: Multidetector CT imaging of the abdomen and pelvis was performed using the standard protocol following bolus administration of intravenous contrast. CONTRAST:  ISOVUE-300 IOPAMIDOL (ISOVUE-300) INJECTION 61% COMPARISON:  CT 12/04/2016 FINDINGS: Lower chest: The lung bases are clear. Hepatobiliary: No focal liver abnormality is seen. No gallstones, gallbladder wall thickening, or biliary dilatation. Pancreas: Normal.  No ductal dilatation or inflammation. Spleen: Normal in size without focal abnormality. Adrenals/Urinary Tract: Normal adrenal glands. No hydronephrosis or perinephric edema. Homogeneous renal enhancement. Urinary bladder is physiologically distended without wall thickening. Stomach/Bowel: Acute appendicitis as described below. The stomach is nondistended. No small bowel dilatation, inflammation or obstruction. Small to moderate colonic stool burden without colonic inflammation. Appendix: Location: Retrocecal Diameter: 11 mm Appendicolith: Possibly, increased density at the base of the appendix (image 25  series 5) may be inspissated contents or noncalcified appendicolith. Mucosal hyper-enhancement: Yes, there is appendiceal wall discontinuity involving the medial aspect of mid distal portion portion image 50 series 2 that may represent early rupture but no periappendiceal fluid collection. Extraluminal gas: No Periappendiceal collection: No. Vascular/Lymphatic: No significant vascular findings are present. No enlarged abdominal or pelvic lymph nodes. Reproductive: Prostate is unremarkable. Other: Small amount of free fluid in the pelvis is likely reactive, no organized fluid collection. No free air. Musculoskeletal: Few scattered Schmorl's nodes in the lower thoracic and lumbar spine. There are  no acute or suspicious osseous abnormalities. IMPRESSION: Acute appendicitis without extraluminal gas or abscess. There is appendiceal wall discontinuity in the mid distal portion that may represent early appendiceal rupture. Electronically Signed   By: Narda RutherfordMelanie  Sanford M.D.   On: 12/01/2017 00:46    EKG: Independently reviewed.    Assessment/Plan Principal Problem:   Acute appendicitis Admit to MedSurg/inpatient. Keep n.p.o. Continue IV fluids. Analgesics as needed. Antiemetics as needed. Continue Zosyn 3.375 g every 8 hours IVPB. General surgery to evaluate.  Active Problems:   Hypokalemia Replacing through IV fluids. Follow-up potassium level.    Elevated bilirubin Please due to decreased oral intake and volume depletion. Follow-up bilirubin level.   DVT prophylaxis: SCDs. Code Status: Full code. Family Communication: Disposition Plan: Made for IV antibiotic therapy, symptoms management, general surgery evaluation.  Will likely need surgery for acute appendicitis. Consults called: Franky MachoMark Jenkins, MD (general surgery). Admission status: Inpatient/MedSurg.   Bobette Moavid Manuel Jarin Cornfield MD [facility] Pager [phone].  If 7PM-7AM, please contact night-coverage [email or web address] Password [email or web address]  12/01/2017, 2:54 AM

## 2017-12-01 NOTE — Transfer of Care (Signed)
Immediate Anesthesia Transfer of Care Note  Patient: Matthew Mullins  Procedure(s) Performed: APPENDECTOMY LAPAROSCOPIC (N/A )  Patient Location: PACU  Anesthesia Type:General  Level of Consciousness: awake and patient cooperative  Airway & Oxygen Therapy: Patient Spontanous Breathing and Patient connected to nasal cannula oxygen  Post-op Assessment: Report given to RN and Post -op Vital signs reviewed and stable  Post vital signs: Reviewed and stable  Last Vitals:  Vitals Value Taken Time  BP    Temp    Pulse 66 12/01/2017 11:24 AM  Resp    SpO2 94 % 12/01/2017 11:24 AM  Vitals shown include unvalidated device data.  Last Pain:  Vitals:   12/01/17 0922  TempSrc: Oral  PainSc: 6       Patients Stated Pain Goal: 0 (12/01/17 [id number])  Complications: No apparent anesthesia complications

## 2017-12-01 NOTE — Anesthesia Procedure Notes (Signed)
Procedure Name: Intubation Date/Time: 12/01/2017 10:37 AM Performed by: Vista Deck, CRNA Pre-anesthesia Checklist: Patient identified, Patient being monitored, Timeout performed, Emergency Drugs available and Suction available Patient Re-evaluated:Patient Re-evaluated prior to induction Oxygen Delivery Method: Circle System Utilized Preoxygenation: Pre-oxygenation with 100% oxygen Induction Type: IV induction Ventilation: Mask ventilation without difficulty Laryngoscope Size: Mac and 3 Grade View: Grade I Tube type: Oral Tube size: 7.0 mm Number of attempts: 1 Airway Equipment and Method: stylet and Oral airway Placement Confirmation: ETT inserted through vocal cords under direct vision,  positive ETCO2 and breath sounds checked- equal and bilateral Secured at: 21 cm Tube secured with: Tape Dental Injury: Teeth and Oropharynx as per pre-operative assessment

## 2017-12-01 NOTE — Op Note (Signed)
Patient:  Marily Lentesaac Manville  DOB:  [birth year]  MRN:  [id number]   Preop Diagnosis: Acute appendicitis  Postop Diagnosis: Same  Procedure: Laparoscopic appendectomy  Surgeon: Franky MachoMark Shayne Diguglielmo, MD  Anes: General endotracheal  Indications: Patient is a 22 year old white male who presented to the emergency room with right lower quadrant abdominal pain.  CT scan of the abdomen confirmed acute appendicitis.  The risks and benefits of the procedure including bleeding, infection, and the possibility of an open procedure were fully explained to the patient, who gave informed consent.  Procedure note: The patient was placed in the supine position after induction of general endotracheal anesthesia.  The abdomen was prepped and draped using the usual sterile technique with DuraPrep.  Surgical site confirmation was performed.  A supraumbilical incision was made down to the fascia.  A Veress needle was introduced into the abdominal cavity and confirmation of placement was done using the saline drop test.  The abdomen was then insufflated to 15 mmHg pressure.  An 11 mm trocar was introduced into the abdominal cavity under direct visualization without difficulty.  The patient was placed in deeper Trendelenburg position and an additional 12 mm trocar was placed in the suprapubic region and a 5 mm trocar was placed left lower quadrant region.  The appendix was visualized and noted to be acutely inflamed.  There was no evidence of perforation.  The mesoappendix was divided using the harmonic scalpel.  The juncture of the appendix to the cecum was fully visualized.  A vascular Endo GIA was placed across the base of the appendix and fired.  The appendix was then removed using an Endo Catch bag without difficulty.  The staple line was inspected and noted to be intact.  Arista was placed along the mesenteric bed.  All fluid and air were then evacuated from the abdominal cavity prior to the removal of the trochars.  All wounds  were irrigated with normal saline.  All wounds were injected with Exparel.  The supraumbilical fascia as well as epigastric fascia were reapproximated using 0 Vicryl interrupted sutures.  All skin incisions were closed using staples.  Betadine ointment and dry sterile dressings were applied.  All tape and needle counts were correct at the end of the procedure.  Patient was extubated in the operating room and transferred to PACU in stable condition.  Complications: None  EBL: Minimal  Specimen: Appendix

## 2017-12-01 NOTE — H&P (Signed)
Matthew Mullins is an 22 y.o. male.   Chief Complaint: Right lower quadrant abdominal pain HPI: Patient is a 22 year old white male who presented to the emergency room with a 24-hour history of worsening right lower quadrant abdominal pain.  CT scan of the abdomen reveals acute appendicitis with question early perforation.  No abscess cavity is seen.  Patient was started on IV antibiotic.  He states his abdominal pain is still present and points to the right lower quadrant region.  His pain is 4 out of 10.  Past Medical History:  Diagnosis Date  . Nephrolithiasis 11/2016   ER visit referred to urology    Past Surgical History:  Procedure Laterality Date  . NO PAST SURGERIES      Family History  Problem Relation Age of Onset  . Arrhythmia Paternal Grandmother        prolonged QT  . Hypertension Paternal Grandmother   . Diabetes Paternal Grandmother   . COPD Paternal Grandfather   . CAD Other        great grandmother  . Stroke Other        great grandmother  . Cancer Other        paternal aunt with leukemia and breast   Social History:  reports that he has never smoked. He has never used smokeless tobacco. He reports that he does not drink alcohol or use drugs.  Allergies: No Known Allergies   (Not in a hospital admission)  Results for orders placed or performed during the hospital encounter of 11/30/17 (from the past 48 hour(s))  Urinalysis, Routine w reflex microscopic     Status: Abnormal   Collection Time: 11/30/17  9:10 PM  Result Value Ref Range   Color, Urine YELLOW YELLOW   APPearance CLEAR CLEAR   Specific Gravity, Urine 1.023 1.005 - 1.030   pH 6.0 5.0 - 8.0   Glucose, UA NEGATIVE NEGATIVE mg/dL   Hgb urine dipstick NEGATIVE NEGATIVE   Bilirubin Urine NEGATIVE NEGATIVE   Ketones, ur 5 (A) NEGATIVE mg/dL   Protein, ur NEGATIVE NEGATIVE mg/dL   Nitrite NEGATIVE NEGATIVE   Leukocytes, UA NEGATIVE NEGATIVE    Comment: Performed at [facility], [address] [place], [place] [address]  CBC with Differential     Status: None   Collection Time: 11/30/17 10:37 PM  Result Value Ref Range   WBC 9.7 4.0 - 10.5 K/uL   RBC 5.48 4.22 - 5.81 MIL/uL   Hemoglobin 16.9 13.0 - 17.0 g/dL   HCT 49.5 39.0 - 52.0 %   MCV 90.3 80.0 - 100.0 fL   MCH 30.8 26.0 - 34.0 pg   MCHC 34.1 30.0 - 36.0 g/dL   RDW 12.6 11.5 - 15.5 %   Platelets 250 150 - 400 K/uL   nRBC 0.0 0.0 - 0.2 %   Neutrophils Relative % 76 %   Neutro Abs 7.3 1.7 - 7.7 K/uL   Lymphocytes Relative 18 %   Lymphs Abs 1.8 0.7 - 4.0 K/uL   Monocytes Relative 6 %   Monocytes Absolute 0.6 0.1 - 1.0 K/uL   Eosinophils Relative 0 %   Eosinophils Absolute 0.0 0.0 - 0.5 K/uL   Basophils Relative 0 %   Basophils Absolute 0.0 0.0 - 0.1 K/uL   Immature Granulocytes 0 %   Abs Immature Granulocytes 0.02 0.00 - 0.07 K/uL    Comment: Performed at [facility], [address] [place], [place] [address]  Comprehensive metabolic panel  Status: Abnormal   Collection Time: 11/30/17 10:37 PM  Result Value Ref Range   Sodium 138 135 - 145 mmol/L   Potassium 3.4 (L) 3.5 - 5.1 mmol/L   Chloride 105 98 - 111 mmol/L   CO2 25 22 - 32 mmol/L   Glucose, Bld 82 70 - 99 mg/dL   BUN 10 6 - 20 mg/dL   Creatinine, Ser 0.94 0.61 - 1.24 mg/dL   Calcium 9.3 8.9 - 10.3 mg/dL   Total Protein 8.4 (H) 6.5 - 8.1 g/dL   Albumin 5.1 (H) 3.5 - 5.0 g/dL   AST 28 15 - 41 U/L   ALT 19 0 - 44 U/L   Alkaline Phosphatase 70 38 - 126 U/L   Total Bilirubin 1.4 (H) 0.3 - 1.2 mg/dL   GFR calc non Af Amer >60 >60 mL/min   GFR calc Af Amer >60 >60 mL/min    Comment: (NOTE) The eGFR has been calculated using the CKD EPI equation. This calculation has not been validated in all clinical situations. eGFR's persistently <60 mL/min signify possible Chronic Kidney Disease.    Anion gap 8 5 - 15    Comment: Performed at [facility], [address] [place], [place] [address]  Magnesium     Status: None   Collection Time: 11/30/17 10:37 PM   Result Value Ref Range   Magnesium 2.0 1.7 - 2.4 mg/dL    Comment: Performed at [facility], [address], [place], [place] [address]  Phosphorus     Status: None   Collection Time: 11/30/17 10:37 PM  Result Value Ref Range   Phosphorus 3.8 2.5 - 4.6 mg/dL    Comment: Performed at [facility], [address] [place], [place] [address]  Comprehensive metabolic panel     Status: Abnormal   Collection Time: 12/01/17  6:18 AM  Result Value Ref Range   Sodium 140 135 - 145 mmol/L   Potassium 4.1 3.5 - 5.1 mmol/L    Comment: DELTA CHECK NOTED   Chloride 109 98 - 111 mmol/L   CO2 25 22 - 32 mmol/L   Glucose, Bld 82 70 - 99 mg/dL   BUN 10 6 - 20 mg/dL   Creatinine, Ser 1.03 0.61 - 1.24 mg/dL   Calcium 8.9 8.9 - 10.3 mg/dL   Total Protein 7.3 6.5 - 8.1 g/dL   Albumin 4.3 3.5 - 5.0 g/dL   AST 21 15 - 41 U/L   ALT 16 0 - 44 U/L   Alkaline Phosphatase 60 38 - 126 U/L   Total Bilirubin 1.5 (H) 0.3 - 1.2 mg/dL   GFR calc non Af Amer >60 >60 mL/min   GFR calc Af Amer >60 >60 mL/min    Comment: (NOTE) The eGFR has been calculated using the CKD EPI equation. This calculation has not been validated in all clinical situations. eGFR's persistently <60 mL/min signify possible Chronic Kidney Disease.    Anion gap 6 5 - 15    Comment: Performed at [facility], [address], [place], [place] [address]  CBC     Status: None   Collection Time: 12/01/17  6:18 AM  Result Value Ref Range   WBC 8.4 4.0 - 10.5 K/uL   RBC 4.84 4.22 - 5.81 MIL/uL   Hemoglobin 15.1 13.0 - 17.0 g/dL   HCT 44.8 39.0 - 52.0 %   MCV 92.6 80.0 - 100.0 fL   MCH 31.2 26.0 - 34.0 pg   MCHC 33.7 30.0 - 36.0  g/dL   RDW 12.8 11.5 - 15.5 %   Platelets 232 150 - 400 K/uL   nRBC 0.0 0.0 - 0.2 %    Comment: Performed at [facility], [address], [place], [place] [address]   Ct Abdomen Pelvis W Contrast  Result Date: 12/01/2017 CLINICAL DATA:  Abdominal pain and distension. Right lower quadrant pain. EXAM: CT ABDOMEN  AND PELVIS WITH CONTRAST TECHNIQUE: Multidetector CT imaging of the abdomen and pelvis was performed using the standard protocol following bolus administration of intravenous contrast. CONTRAST:  124m ISOVUE-300 IOPAMIDOL (ISOVUE-300) INJECTION 61% COMPARISON:  CT 12/04/2016 FINDINGS: Lower chest: The lung bases are clear. Hepatobiliary: No focal liver abnormality is seen. No gallstones, gallbladder wall thickening, or biliary dilatation. Pancreas: Normal.  No ductal dilatation or inflammation. Spleen: Normal in size without focal abnormality. Adrenals/Urinary Tract: Normal adrenal glands. No hydronephrosis or perinephric edema. Homogeneous renal enhancement. Urinary bladder is physiologically distended without wall thickening. Stomach/Bowel: Acute appendicitis as described below. The stomach is nondistended. No small bowel dilatation, inflammation or obstruction. Small to moderate colonic stool burden without colonic inflammation. Appendix: Location: Retrocecal Diameter: 11 mm Appendicolith: Possibly, increased density at the base of the appendix (image 25 series 5) may be inspissated contents or noncalcified appendicolith. Mucosal hyper-enhancement: Yes, there is appendiceal wall discontinuity involving the medial aspect of mid distal portion portion image 50 series 2 that may represent early rupture but no periappendiceal fluid collection. Extraluminal gas: No Periappendiceal collection: No. Vascular/Lymphatic: No significant vascular findings are present. No enlarged abdominal or pelvic lymph nodes. Reproductive: Prostate is unremarkable. Other: Small amount of free fluid in the pelvis is likely reactive, no organized fluid collection. No free air. Musculoskeletal: Few scattered Schmorl's nodes in the lower thoracic and lumbar spine. There are no acute or suspicious osseous abnormalities. IMPRESSION: Acute appendicitis without extraluminal gas or abscess. There is appendiceal wall discontinuity in the mid  distal portion that may represent early appendiceal rupture. Electronically Signed   By: MKeith RakeM.D.   On: 12/01/2017 00:46    Review of Systems  Constitutional: Positive for malaise/fatigue.  HENT: Negative.   Eyes: Negative.   Respiratory: Negative.   Cardiovascular: Negative.   Gastrointestinal: Positive for abdominal pain and heartburn.  Genitourinary: Negative.   Musculoskeletal: Negative.   Skin: Negative.   Neurological: Negative.   Endo/Heme/Allergies: Negative.   Psychiatric/Behavioral: Negative.     Blood pressure 111/61, pulse (!) 46, temperature 98.1 F (36.7 C), temperature source Oral, resp. rate 15, height 5' 5" (1.651 m), weight 56.7 kg, SpO2 99 %. Physical Exam  Vitals reviewed. Constitutional: He is oriented to person, place, and time. He appears well-developed and well-nourished. No distress.  HENT:  Head: Normocephalic and atraumatic.  Cardiovascular: Normal rate, regular rhythm and normal heart sounds. Exam reveals no gallop and no friction rub.  No murmur heard. Respiratory: Effort normal and breath sounds normal. No respiratory distress. He has no wheezes. He has no rales.  GI: Soft. Bowel sounds are normal. He exhibits no distension. There is tenderness. There is no rebound and no guarding.  Pain to palpation just lateral to McBurney's point.  No rigidity is noted.  Neurological: He is alert and oriented to person, place, and time.  Skin: Skin is warm and dry.    CT scan images personally reviewed Assessment/Plan Impression: Acute appendicitis Plan: Patient be taken to the operating room for laparoscopic appendectomy.  The risks and benefits of the procedure including bleeding, infection, and the possibility of an open  procedure were fully explained to the patient, who gave informed consent.  Aviva Signs, MD 12/01/2017, 7:28 AM

## 2017-12-01 NOTE — Anesthesia Postprocedure Evaluation (Signed)
Anesthesia Post Note  Patient: Matthew Mullins  Procedure(s) Performed: APPENDECTOMY LAPAROSCOPIC (N/A )  Patient location during evaluation: Short Stay Anesthesia Type: General Level of consciousness: awake and alert and patient cooperative Pain management: pain level controlled Vital Signs Assessment: post-procedure vital signs reviewed and stable Respiratory status: spontaneous breathing Cardiovascular status: stable Postop Assessment: no apparent nausea or vomiting Anesthetic complications: no     Last Vitals:  Vitals:   12/01/17 1145 12/01/17 1201  BP: 120/60 116/61  Pulse: 69 64  Resp: 12 16  Temp:  36.6 C  SpO2: 100% 100%    Last Pain:  Vitals:   12/01/17 1201  TempSrc: Oral  PainSc: 0-No pain                 Kynnedy Carreno

## 2017-12-01 NOTE — Progress Notes (Signed)
PROGRESS NOTE  Matthew Mullins ION:[id number] DOB: [birth year] DOA: 11/30/2017 PCP: [facility]  Brief History:  22 year old male with no documented chronic medical history presented with abdominal pain that began on the evening of 11/29/2017.  The patient denies eating anything unusual or raw or undercooked foods.  She denies any recent travels.  Patient subsequently went to bed and woke up in the morning of 11/30/2017 with worsening right lower quadrant abdominal pain.  He had some nausea but denied vomiting or diarrhea.  He had no fevers, chills, dysuria, hematuria, hematochezia, melena.  His last bowel movement was in the morning of 11/30/2017 prior to coming to the emergency department.  Upon presentation, the patient was noted to have essentially unremarkable BMP, LFTs, and CBC.  Urinalysis was negative for pyuria.  CT of the abdomen and pelvis showed appendicitis without extraluminal gas or abscess.  There was appendiceal wall discontinuity with the mid distal portion that may represent early appendiceal rupture.  Patient was started on IV fluids and Zosyn.  General surgery was consulted to assist with management.  Assessment/Plan: Acute appendicitis -11/25 CT abd--increased density @ base of appendix--inspissated contents vs appendicolith;  Appendiceal wall discontinuity involving medial aspect of mid distal portion--possible early rupture -continue zosyn -remain npo -general surgery consult  Hypokalemia -repleted -mag 2.0  Hyperbilirubinemia -fractionate bili   Disposition Plan:   Home in 2-3 days  Family Communication:   Mother updated at bedside--Total time spent 35 minutes.  Greater than 50% spent face to face counseling and coordinating care.  2440 to 0725   Consultants:  General surgery  Code Status:  FULL   DVT Prophylaxis:  SCDs   Procedures: As Listed in Progress Note Above  Antibiotics: Zosyn  11/24>>>     Subjective: Patient continues to have right lower quadrant abdominal pain, but it is a little bit better than yesterday.  He denies any nausea, vomiting, diarrhea, dysuria, hematuria, hematochezia, melena.  He denies any chest pain or shortness of breath.  There is no fevers or chills.  He denies any headache or neck pain.  Objective: Vitals:   12/01/17 0330 12/01/17 0400 12/01/17 0612 12/01/17 0630  BP: 103/63 (!) 102/47 (!) 95/49 111/61  Pulse: (!) 49 (!) 48 (!) 46 (!) 46  Resp: 16 16 16 15   Temp:      TempSrc:      SpO2: 99% 97% 99% 99%  Weight:      Height:        Intake/Output Summary (Last 24 hours) at 12/01/2017 0720 Last data filed at 12/01/2017 0423 Gross per 24 hour  Intake 1600 ml  Output -  Net 1600 ml   Weight change:  Exam:   General:  Pt is alert, follows commands appropriately, not in acute distress  HEENT: No icterus, No thrush, No neck mass, [place]/AT  Cardiovascular: RRR, S1/S2, no rubs, no gallops  Respiratory: CTA bilaterally, no wheezing, no crackles, no rhonchi  Abdomen: Soft/+BS, RLQ tender, non distended, no guarding  Extremities: No edema, No lymphangitis, No petechiae, No rashes, no synovitis   Data Reviewed: I have personally reviewed following labs and imaging studies Basic Metabolic Panel: Recent Labs  Lab 11/30/17 2237 12/01/17 0618  NA 138 140  K 3.4* 4.1  CL 105 109  CO2 25 25  GLUCOSE 82 82  BUN 10 10  CREATININE 0.94 1.03  CALCIUM 9.3 8.9  MG 2.0  --  PHOS 3.8  --    Liver Function Tests: Recent Labs  Lab 11/30/17 2237 12/01/17 0618  AST 28 21  ALT 19 16  ALKPHOS 70 60  BILITOT 1.4* 1.5*  PROT 8.4* 7.3  ALBUMIN 5.1* 4.3   No results for input(s): LIPASE, AMYLASE in the last 168 hours. No results for input(s): AMMONIA in the last 168 hours. Coagulation Profile: No results for input(s): INR, PROTIME in the last 168 hours. CBC: Recent Labs  Lab 11/30/17 2237 12/01/17 0618  WBC 9.7 8.4   NEUTROABS 7.3  --   HGB 16.9 15.1  HCT 49.5 44.8  MCV 90.3 92.6  PLT 250 232   Cardiac Enzymes: No results for input(s): CKTOTAL, CKMB, CKMBINDEX, TROPONINI in the last 168 hours. BNP: Invalid input(s): POCBNP CBG: No results for input(s): GLUCAP in the last 168 hours. HbA1C: No results for input(s): HGBA1C in the last 72 hours. Urine analysis:    Component Value Date/Time   COLORURINE YELLOW 11/30/2017 2110   APPEARANCEUR CLEAR 11/30/2017 2110   LABSPEC 1.023 11/30/2017 2110   PHURINE 6.0 11/30/2017 2110   GLUCOSEU NEGATIVE 11/30/2017 2110   HGBUR NEGATIVE 11/30/2017 2110   BILIRUBINUR NEGATIVE 11/30/2017 2110   KETONESUR 5 (A) 11/30/2017 2110   PROTEINUR NEGATIVE 11/30/2017 2110   NITRITE NEGATIVE 11/30/2017 2110   LEUKOCYTESUR NEGATIVE 11/30/2017 2110   Sepsis Labs: @LABRCNTIP (procalcitonin:4,lacticidven:4) )No results found for this or any previous visit (from the past 240 hour(s)).   Scheduled Meds: Continuous Infusions: . 0.9 % NaCl with KCl 20 mEq / L 125 mL/hr at 12/01/17 0609  . famotidine (PEPCID) IV Stopped (12/01/17 0206)  . piperacillin-tazobactam (ZOSYN)  IV      Procedures/Studies: Ct Abdomen Pelvis W Contrast  Result Date: 12/01/2017 CLINICAL DATA:  Abdominal pain and distension. Right lower quadrant pain. EXAM: CT ABDOMEN AND PELVIS WITH CONTRAST TECHNIQUE: Multidetector CT imaging of the abdomen and pelvis was performed using the standard protocol following bolus administration of intravenous contrast. CONTRAST:  ISOVUE-300 IOPAMIDOL (ISOVUE-300) INJECTION 61% COMPARISON:  CT 12/04/2016 FINDINGS: Lower chest: The lung bases are clear. Hepatobiliary: No focal liver abnormality is seen. No gallstones, gallbladder wall thickening, or biliary dilatation. Pancreas: Normal.  No ductal dilatation or inflammation. Spleen: Normal in size without focal abnormality. Adrenals/Urinary Tract: Normal adrenal glands. No hydronephrosis or perinephric edema.  Homogeneous renal enhancement. Urinary bladder is physiologically distended without wall thickening. Stomach/Bowel: Acute appendicitis as described below. The stomach is nondistended. No small bowel dilatation, inflammation or obstruction. Small to moderate colonic stool burden without colonic inflammation. Appendix: Location: Retrocecal Diameter: 11 mm Appendicolith: Possibly, increased density at the base of the appendix (image 25 series 5) may be inspissated contents or noncalcified appendicolith. Mucosal hyper-enhancement: Yes, there is appendiceal wall discontinuity involving the medial aspect of mid distal portion portion image 50 series 2 that may represent early rupture but no periappendiceal fluid collection. Extraluminal gas: No Periappendiceal collection: No. Vascular/Lymphatic: No significant vascular findings are present. No enlarged abdominal or pelvic lymph nodes. Reproductive: Prostate is unremarkable. Other: Small amount of free fluid in the pelvis is likely reactive, no organized fluid collection. No free air. Musculoskeletal: Few scattered Schmorl's nodes in the lower thoracic and lumbar spine. There are no acute or suspicious osseous abnormalities. IMPRESSION: Acute appendicitis without extraluminal gas or abscess. There is appendiceal wall discontinuity in the mid distal portion that may represent early appendiceal rupture. Electronically Signed   By: Narda Rutherford M.D.   On: 12/01/2017 00:46  Catarina Hartshornavid Christohper Dube, DO  [facility] Pager [phone]  If 7PM-7AM, please contact night-coverage [email or web address] Password [email or web address] 12/01/2017, 7:20 AM   LOS: 0 days

## 2017-12-01 NOTE — Discharge Instructions (Signed)
Laparoscopic Appendectomy, Adult, Care After °Refer to this sheet in the next few weeks. These instructions provide you with information about caring for yourself after your procedure. Your health care provider may also give you more specific instructions. Your treatment has been planned according to current medical practices, but problems sometimes occur. Call your health care provider if you have any problems or questions after your procedure. °What can I expect after the procedure? °After the procedure, it is common to have: °· A decrease in your energy level. °· Mild pain in the area where the surgical cuts (incisions) were made. °· Constipation. This can be caused by pain medicine and a decrease in your activity. ° °Follow these instructions at home: °Medicines °· Take over-the-counter and prescription medicines only as told by your health care provider. °· Do not drive for 24 hours if you received a sedative. °· Do not drive or operate heavy machinery while taking prescription pain medicine. °· If you were prescribed an antibiotic medicine, take it as told by your health care provider. Do not stop taking the antibiotic even if you start to feel better. °Activity °· For 3 weeks or as long as told by your health care provider: °? Do not lift anything that is heavier than 10 pounds (4.5 kg). °? Do not play contact sports. °· Gradually return to your normal activities. Ask your health care provider what activities are safe for you. °Bathing °· Keep your incisions clean and dry. Clean them as often as told by your health care provider: °? Gently wash the incisions with soap and water. °? Rinse the incisions with water to remove all soap. °? Pat the incisions dry with a clean towel. Do not rub the incisions. °· You may take showers after 48 hours. °· Do not take baths, swim, or use hot tubs for 2 weeks or as told by your health care provider. °Incision care °· Follow instructions from your healthcare provider about  how to take care of your incisions. Make sure you: °? Wash your hands with soap and water before you change your bandage (dressing). If soap and water are not available, use hand sanitizer. °? Change your dressing as told by your health care provider. °? Leave stitches (sutures), skin glue, or adhesive strips in place. These skin closures may need to stay in place for 2 weeks or longer. If adhesive strip edges start to loosen and curl up, you may trim the loose edges. Do not remove adhesive strips completely unless your health care provider tells you to do that. °· Check your incision areas every day for signs of infection. Check for: °? More redness, swelling, or pain. °? More fluid or blood. °? Warmth. °? Pus or a bad smell. °Other Instructions °· If you were sent home with a drain, follow instructions from your health care provider about how to care for the drain and how to empty it. °· Take deep breaths. This helps to prevent your lungs from becoming inflamed. °· To relieve and prevent constipation: °? Drink plenty of fluids. °? Eat plenty of fruits and vegetables. °· Keep all follow-up visits as told by your health care provider. This is important. °Contact a health care provider if: °· You have more redness, swelling, or pain around an incision. °· You have more fluid or blood coming from an incision. °· Your incision feels warm to the touch. °· You have pus or a bad smell coming from an incision or dressing. °· Your incision   edges break open after your sutures have been removed. °· You have increasing pain in your shoulders. °· You feel dizzy or you faint. °· You develop shortness of breath. °· You keep feeling nauseous or vomiting. °· You have diarrhea or you cannot control your bowel functions. °· You lose your appetite. °· You develop swelling or pain in your legs. °Get help right away if: °· You have a fever. °· You develop a rash. °· You have difficulty breathing. °· You have sharp pains in your  chest. °This information is not intended to replace advice given to you by your health care provider. Make sure you discuss any questions you have with your health care provider. °Document Released: 12/24/2004 Document Revised: 05/26/2015 Document Reviewed: 06/13/2014 °Elsevier Interactive Patient Education © 2018 [employer] Inc. ° °

## 2017-12-01 NOTE — ED Notes (Signed)
Pt in CT.

## 2017-12-01 NOTE — Anesthesia Preprocedure Evaluation (Signed)
Anesthesia Evaluation  Patient identified by MRN, date of birth, ID band Patient awake    Reviewed: Allergy & Precautions, H&P , NPO status , Patient's Chart, lab work & pertinent test results  Airway Mallampati: I  TM Distance: >3 FB Neck ROM: full    Dental no notable dental hx.    Pulmonary neg pulmonary ROS,    Pulmonary exam normal breath sounds clear to auscultation       Cardiovascular Exercise Tolerance: Good negative cardio ROS   Rhythm:regular Rate:Normal     Neuro/Psych negative neurological ROS  negative psych ROS   GI/Hepatic negative GI ROS, Neg liver ROS,   Endo/Other  negative endocrine ROS  Renal/GU   negative genitourinary   Musculoskeletal   Abdominal   Peds  Hematology negative hematology ROS (+)   Anesthesia Other Findings   Reproductive/Obstetrics negative OB ROS                             Anesthesia Physical Anesthesia Plan  ASA: II and emergent  Anesthesia Plan: General   Post-op Pain Management:    Induction:   PONV Risk Score and Plan:   Airway Management Planned:   Additional Equipment:   Intra-op Plan:   Post-operative Plan:   Informed Consent: I have reviewed the patients History and Physical, chart, labs and discussed the procedure including the risks, benefits and alternatives for the proposed anesthesia with the patient or authorized representative who has indicated his/her understanding and acceptance.   Dental Advisory Given  Plan Discussed with: CRNA  Anesthesia Plan Comments:         Anesthesia Quick Evaluation

## 2017-12-01 NOTE — ED Provider Notes (Signed)
  Physical Exam  BP (!) 99/51   Pulse 79   Temp 98.1 F (36.7 C) (Oral)   Resp 16   Ht 5\' 5"  (1.651 m)   Wt 56.7 kg   SpO2 98%   BMI 20.80 kg/m   Physical Exam  ED Course/Procedures     Procedures  MDM    I assumed the care for [name] from Dr. Estell HarpinZammit.  Patient had come in with abdominal pain for the last 2 days that has gotten worse.  CT scan was pending, results have come back and it appears that patient has acute appendicitis.  There is questionable perforation.  I discussed the case with Dr. Lovell SheehanJenkins, general surgery.  He recommends that patient be admitted to hospitalist and be put on Zosyn.  Upon my reassessment, patient appears hemodynamically stable.  He does not have any systemic symptoms and we do not think patient is septic.  We will proceed with admission with the hospitalist team.  CRITICAL CARE Performed by: Taj Arteaga   Total critical care time: 31 minutes for perforated viscus  Critical care time was exclusive of separately billable procedures and treating other patients.  Critical care was necessary to treat or prevent imminent or life-threatening deterioration.  Critical care was time spent personally by me on the following activities: development of treatment plan with patient and/or surrogate as well as nursing, discussions with consultants, evaluation of patient's response to treatment, examination of patient, obtaining history from patient or surrogate, ordering and performing treatments and interventions, ordering and review of laboratory studies, ordering and review of radiographic studies, pulse oximetry and re-evaluation of patient's condition.        Matthew KaplanNanavati, Matthew Staub, MD 12/01/17 [phone]

## 2017-12-01 NOTE — Interval H&P Note (Signed)
History and Physical Interval Note:  12/01/2017 9:58 AM  Matthew LenteIsaac Mullins  has presented today for surgery, with the diagnosis of acute appendicitis  The various methods of treatment have been discussed with the patient and family. After consideration of risks, benefits and other options for treatment, the patient has consented to  Procedure(s): APPENDECTOMY LAPAROSCOPIC (N/A) as a surgical intervention .  The patient's history has been reviewed, patient examined, no change in status, stable for surgery.  I have reviewed the patient's chart and labs.  Questions were answered to the patient's satisfaction.     Franky MachoMark Sharanya Templin

## 2017-12-01 NOTE — Addendum Note (Signed)
Addendum  created 12/01/17 1213 by Moshe Salisburyaniel, Syriana Croslin E, CRNA   Intraprocedure Staff edited

## 2017-12-02 ENCOUNTER — Encounter (HOSPITAL_COMMUNITY): Payer: Self-pay | Admitting: General Surgery

## 2017-12-02 LAB — HIV ANTIBODY (ROUTINE TESTING W REFLEX): HIV Screen 4th Generation wRfx: NONREACTIVE

## 2017-12-03 ENCOUNTER — Other Ambulatory Visit: Payer: Self-pay | Admitting: *Deleted

## 2017-12-03 NOTE — Patient Outreach (Addendum)
[facility] Care Management  12/03/2017  Matthew Mullins [birth year] [id number]   Transition of care  Referral date: 12/02/17 Referral source: Hospital Business Intelligence (BI) Report [name]  [name] [facility] for  acute appendicitis and underwent laparoscopic appendectomy. He was discharged to home on 12/01/17.  Unsuccessful initial outreach to patient at listed contact number with purpose of completing transition of care call. HIPAA compliant voice message left with call back phone number.  PLAN: RNCM will attempt second outreach within 4 business days Will route unsuccessful outreach letter to be mailed to patient's home address.  [name] RN,CCM,CDE [facility] Care Management Coordinator Office Phone [phone] Office Fax [phone]

## 2017-12-09 ENCOUNTER — Encounter: Payer: Self-pay | Admitting: General Surgery

## 2017-12-09 ENCOUNTER — Ambulatory Visit (INDEPENDENT_AMBULATORY_CARE_PROVIDER_SITE_OTHER): Payer: Self-pay | Admitting: General Surgery

## 2017-12-09 VITALS — BP 130/81 | HR 61 | Temp 99.3°F | Resp 20 | Wt 122.2 lb

## 2017-12-09 DIAGNOSIS — Z09 Encounter for follow-up examination after completed treatment for conditions other than malignant neoplasm: Secondary | ICD-10-CM

## 2017-12-09 NOTE — Progress Notes (Signed)
Subjective:     Matthew LenteIsaac Mullins  Here for follow-up, status post laparoscopic appendectomy.  Doing well.  Has no complaints. Objective:    BP 130/81 (BP Location: Left Arm, Patient Position: Sitting, Cuff Size: Normal)   Pulse 61   Temp 99.3 F (37.4 C) (Temporal)   Resp 20   Wt 122 lb 3.2 oz (55.4 kg)   BMI 20.34 kg/m   General:  alert, cooperative and no distress  Abdomen soft, incisions healing well.  Staples removed, Steri-Strips applied. Final pathology consistent with diagnosis.     Assessment:    Doing well postoperatively.    Plan:   May return to normal activity.  Follow-up here as needed.

## 2017-12-10 ENCOUNTER — Other Ambulatory Visit: Payer: Self-pay | Admitting: *Deleted

## 2017-12-10 NOTE — Patient Outreach (Addendum)
[facility] Care Management  12/10/2017  Matthew Mullins [birth year] [id number]    Subjective: Telephone call to patient's home  number, no answer, voicemail full, and unable to leave a message.      Objective: Per KPN (Knowledge Performance Now, point of care tool) and chart review, patient hospitalized 11/30/17 - 12/01/17 for Acute appendicitis, status post Laparoscopic appendectomy on 12/01/17.     Patient also has a history of nephrolithiasis.     Assessment:    Received [facility] Transition of care referral on 12/02/17.   Transition of care follow up pending patient contact.      Plan: Covering RNCM has sent patient unsuccessful outreach letter, [facility] pamph[facility], will call patient for 3rd telephone outreach attempt, transition of care follow up, and proceed with case closure, within 10 business days if no return call.        Sota Hetz H. Gardiner Barefootooper RN, BSN, CCM [facility] Care Management [facility] Telephonic CM Phone: [phone] Fax: [phone]

## 2017-12-12 ENCOUNTER — Other Ambulatory Visit: Payer: Self-pay | Admitting: *Deleted

## 2017-12-12 NOTE — Patient Outreach (Signed)
[facility] Care Management  12/12/2017  Matthew Mullins 05-02-1995 [id number]   Subjective: Telephone call to patient's home  number, no answer, voicemail full, and unable to leave a message.      Objective: Per KPN (Knowledge Performance Now, point of care tool) and chart review, patient hospitalized 11/30/17 - 12/01/17 for Acute appendicitis, status post Laparoscopic appendectomy on 12/01/17.     Patient also has a history of nephrolithiasis.     Assessment:    Received [facility] Transition of care referral on 12/02/17.   Transition of care follow up pending patient contact.      Plan: Covering RNCM has sent patient unsuccessful outreach letter, [facility] pamph[facility], and will proceed with case closure, within 10 business days if no return call.       Nevaeh Casillas H. Gardiner Barefootooper RN, BSN, CCM [facility] Care Management [facility] Telephonic CM Phone: [phone] Fax: [phone]

## 2017-12-19 ENCOUNTER — Other Ambulatory Visit: Payer: Self-pay | Admitting: *Deleted

## 2017-12-19 NOTE — Patient Outreach (Signed)
[facility]) Care Management  12/19/2017  Matthew Mullins 1995/02/19 [id number]   No response from patient outreach attempts will proceed with case closure.    Objective:Per KPN (Knowledge Performance Now, point of care tool) and chart review,patient hospitalized 11/30/17 - 12/01/17 forAcute appendicitis, status postLaparoscopic appendectomyon 12/01/17. Patient also has a history of nephrolithiasis.     Assessment: Received [facility] Transition of care referral on 12/02/17. Transition of care follow up not completed due to unable to contact patient and will proceed with case closure.     Plan:Case closure due to unable to reach.        Charmaine Placido H. Gardiner Barefootooper RN, BSN, CCM [facility] Care [facility] Telephonic CM Phone: [phone] Fax: [phone]

## 2018-04-28 ENCOUNTER — Ambulatory Visit (INDEPENDENT_AMBULATORY_CARE_PROVIDER_SITE_OTHER): Payer: 59 | Admitting: Family Medicine

## 2018-04-28 ENCOUNTER — Encounter: Payer: Self-pay | Admitting: Family Medicine

## 2018-04-28 ENCOUNTER — Other Ambulatory Visit (INDEPENDENT_AMBULATORY_CARE_PROVIDER_SITE_OTHER): Payer: 59

## 2018-04-28 VITALS — BP 118/68 | HR 62 | Ht 65.0 in | Wt 125.0 lb

## 2018-04-28 DIAGNOSIS — R109 Unspecified abdominal pain: Secondary | ICD-10-CM | POA: Diagnosis not present

## 2018-04-28 LAB — CBC WITH DIFFERENTIAL/PLATELET
Basophils Absolute: 0 10*3/uL (ref 0.0–0.1)
Basophils Relative: 0.8 % (ref 0.0–3.0)
Eosinophils Absolute: 0 10*3/uL (ref 0.0–0.7)
Eosinophils Relative: 0.7 % (ref 0.0–5.0)
HCT: 49.8 % (ref 39.0–52.0)
Hemoglobin: 16.9 g/dL (ref 13.0–17.0)
Lymphocytes Relative: 28.9 % (ref 12.0–46.0)
Lymphs Abs: 1.3 10*3/uL (ref 0.7–4.0)
MCHC: 34 g/dL (ref 30.0–36.0)
MCV: 92.7 fl (ref 78.0–100.0)
Monocytes Absolute: 0.3 10*3/uL (ref 0.1–1.0)
Monocytes Relative: 7.4 % (ref 3.0–12.0)
Neutro Abs: 2.8 10*3/uL (ref 1.4–7.7)
Neutrophils Relative %: 62.2 % (ref 43.0–77.0)
Platelets: 252 10*3/uL (ref 150.0–400.0)
RBC: 5.37 Mil/uL (ref 4.22–5.81)
RDW: 13 % (ref 11.5–15.5)
WBC: 4.4 10*3/uL (ref 4.0–10.5)

## 2018-04-28 LAB — COMPREHENSIVE METABOLIC PANEL
ALT: 19 U/L (ref 0–53)
AST: 26 U/L (ref 0–37)
Albumin: 4.9 g/dL (ref 3.5–5.2)
Alkaline Phosphatase: 72 U/L (ref 39–117)
BUN: 13 mg/dL (ref 6–23)
CO2: 28 mEq/L (ref 19–32)
Calcium: 9.5 mg/dL (ref 8.4–10.5)
Chloride: 100 mEq/L (ref 96–112)
Creatinine, Ser: 0.91 mg/dL (ref 0.40–1.50)
GFR: 105.09 mL/min (ref >60.00)
Glucose, Bld: 84 mg/dL (ref 70–99)
Potassium: 3.7 mEq/L (ref 3.5–5.1)
Sodium: 137 mEq/L (ref 135–145)
Total Bilirubin: 0.8 mg/dL (ref 0.2–1.2)
Total Protein: 7.8 g/dL (ref 6.0–8.3)

## 2018-04-28 LAB — POCT URINALYSIS DIPSTICK
Bilirubin, UA: NEGATIVE
Blood, UA: NEGATIVE
Glucose, UA: NEGATIVE
Leukocytes, UA: NEGATIVE
Nitrite, UA: NEGATIVE
Protein, UA: NEGATIVE
Spec Grav, UA: 1.015 (ref 1.010–1.025)
Urobilinogen, UA: 0.2 E.U./dL
pH, UA: 8 (ref 5.0–8.0)

## 2018-04-28 LAB — LIPASE: Lipase: 15 U/L (ref 11.0–59.0)

## 2018-04-28 NOTE — Progress Notes (Addendum)
Virtual visit completed through [employer]. Due to national recommendations of social distancing due to COVID 19, a virtual visit is felt to be most appropriate for this patient at this time.   Patient location: home Provider location: [facility] [place], office If any vitals were documented, they were collected by patient at home unless specified below. Interactive audio and video telecommunications were attempted between myself and Matthew Lentesaac Dooly, however failed due to patient having technical difficulties (poor connection bandwith). We continued and completed visit with audio only.   BP 118/68 (BP Location: Left Arm, Patient Position: Sitting, Cuff Size: Normal)   Pulse 62   Ht 5\' 5"  (1.651 m)   Wt 125 lb (56.7 kg)   SpO2 99%   BMI 20.80 kg/m    CC: abd pain Subjective:    Patient ID: Matthew Mullins, male    DOB: 1995-11-17, 23 y.o.   MRN: [id number]  HPI: Matthew Mullins is a 23 y.o. male presenting on 04/28/2018 for Abdominal Pain (C/o RUQ pain and dizziness. Denies any N/V/D. Sxs started on 04/25/18. )   Last seen here 2018.   5d h/o dizziness described as lightheadedness and fatigue associated with RUQ abd discomfort at lower ribcage described as initial sharp pain, now more constant soreness. Currently 1/10. At its worse 6-7/10 on Saturday. Mild nausea. No pain with palpation of ribcage. Some dysuria. Doesn't remember meal prior to symptom onset but thinks it was fatty/greasy.  No vertigo, presyncope or syncope. No back pain, fevers/chills, vomiting, diarrhea/constipation, blood in stool or urine.   No known h/o gallstones.  S/p lap appendectomy 12/2017, recovered well.  H/o kidney stone, this does not feel similar.   Occasional alcohol - Saturday drank 1 bottle smirnoff.  Non smoker. No recreational drugs.  No supplements or vitamins or medicines.      Relevant past medical, surgical, family and social history reviewed and updated as indicated. Interim medical history  since our last visit reviewed. Allergies and medications reviewed and updated. Outpatient Medications Prior to Visit  Medication Sig Dispense Refill  . HYDROcodone-acetaminophen (NORCO) 5-325 MG tablet Take 1 tablet by mouth every 4 (four) hours as needed for moderate pain. (Patient not taking: Reported on 12/09/2017) 30 tablet 0   No facility-administered medications prior to visit.      Per HPI unless specifically indicated in ROS section below Review of Systems Objective:    BP 118/68 (BP Location: Left Arm, Patient Position: Sitting, Cuff Size: Normal)   Pulse 62   Ht 5\' 5"  (1.651 m)   Wt 125 lb (56.7 kg)   SpO2 99%   BMI 20.80 kg/m   Wt Readings from Last 3 Encounters:  04/28/18 125 lb (56.7 kg)  12/09/17 122 lb 3.2 oz (55.4 kg)  12/01/17 125 lb (56.7 kg)     Physical exam: Gen: alert, NAD, not ill appearing Pulm: speaks in complete sentences without increased work of breathing Psych: normal mood, normal thought content  ABD: evaluated when he came in for labs/UA - NTND, no CVA tenderness bilaterally, no reproducible pain to palpation of lower ribcage or to palpation of entire abdomen, neg murphy sign.      Assessment & Plan:   Problem List Items Addressed This Visit    Right sided abdominal pain - Primary    5d h/o RUQ abd discomfort associated with mild dizziness. Vitals stable. Suspect gallbladder disease. Will check labwork, urinalysis and abd US to eval for gallstones vs other. Will call to schedule and update  with results. Pt agrees with plan. Discussed if worsening or unresolving symptoms will recommend in office evaluation.       Relevant Orders   Comprehensive metabolic panel   CBC with Differential/Platelet   Lipase   Urinalysis Dipstick   US Abdomen Complete       No orders of the defined types were placed in this encounter.  Orders Placed This Encounter  Procedures  . US Abdomen Complete    Standing Status:   Future    Standing Expiration Date:    06/28/2019    Order Specific Question:   Reason for Exam (SYMPTOM  OR DIAGNOSIS REQUIRED)    Answer:   R sided abd pain, h/o kidney stone    Order Specific Question:   Preferred imaging location?    Answer:   [facility]  . Comprehensive metabolic panel    Standing Status:   Future    Standing Expiration Date:   04/28/2019  . CBC with Differential/Platelet    Standing Status:   Future    Standing Expiration Date:   04/28/2019  . Lipase    Standing Status:   Future    Standing Expiration Date:   04/28/2019  . Urinalysis Dipstick    Standing Status:   Future    Standing Expiration Date:   05/28/2018    Follow up plan: Return if symptoms worsen or fail to improve.  Eustaquio Boyden, MD

## 2018-04-28 NOTE — Assessment & Plan Note (Addendum)
5d h/o RUQ abd discomfort associated with mild dizziness. Vitals stable. Suspect gallbladder disease. Will check labwork, urinalysis and abd [place] to eval for gallstones vs other. Will call to schedule and update with results. Pt agrees with plan. Discussed if worsening or unresolving symptoms will recommend in office evaluation.

## 2018-04-30 ENCOUNTER — Ambulatory Visit
Admission: RE | Admit: 2018-04-30 | Discharge: 2018-04-30 | Disposition: A | Discharge status: Home / Self Care | Payer: 59 | Source: Ambulatory Visit | Attending: Family Medicine | Admitting: Family Medicine

## 2018-04-30 ENCOUNTER — Other Ambulatory Visit: Payer: Self-pay

## 2018-04-30 DIAGNOSIS — R109 Unspecified abdominal pain: Secondary | ICD-10-CM | POA: Insufficient documentation

## 2018-05-01 ENCOUNTER — Ambulatory Visit: Payer: 59

## 2018-09-04 DIAGNOSIS — R1013 Epigastric pain: Secondary | ICD-10-CM | POA: Diagnosis not present

## 2018-09-25 ENCOUNTER — Other Ambulatory Visit: Payer: Self-pay | Admitting: Nurse Practitioner

## 2018-09-25 ENCOUNTER — Other Ambulatory Visit (HOSPITAL_COMMUNITY): Payer: Self-pay | Admitting: Nurse Practitioner

## 2018-09-25 DIAGNOSIS — R1011 Right upper quadrant pain: Secondary | ICD-10-CM | POA: Diagnosis not present

## 2018-09-25 DIAGNOSIS — R1013 Epigastric pain: Secondary | ICD-10-CM | POA: Diagnosis not present

## 2018-10-02 ENCOUNTER — Ambulatory Visit (HOSPITAL_COMMUNITY)
Admission: RE | Admit: 2018-10-02 | Discharge: 2018-10-02 | Disposition: A | Discharge status: Home / Self Care | Payer: 59 | Source: Ambulatory Visit | Attending: Nurse Practitioner | Admitting: Nurse Practitioner

## 2018-10-02 ENCOUNTER — Other Ambulatory Visit: Payer: Self-pay

## 2018-10-02 DIAGNOSIS — R1011 Right upper quadrant pain: Secondary | ICD-10-CM | POA: Insufficient documentation

## 2018-10-07 ENCOUNTER — Encounter: Payer: Self-pay | Admitting: Internal Medicine

## 2018-10-21 ENCOUNTER — Ambulatory Visit (INDEPENDENT_AMBULATORY_CARE_PROVIDER_SITE_OTHER): Payer: 59 | Admitting: Nurse Practitioner

## 2018-10-21 ENCOUNTER — Other Ambulatory Visit: Payer: Self-pay

## 2018-10-21 ENCOUNTER — Encounter: Payer: Self-pay | Admitting: Nurse Practitioner

## 2018-10-21 VITALS — BP 130/74 | HR 67 | Temp 97.5°F | Ht 65.0 in | Wt 118.4 lb

## 2018-10-21 DIAGNOSIS — R1011 Right upper quadrant pain: Secondary | ICD-10-CM | POA: Diagnosis not present

## 2018-10-21 DIAGNOSIS — R109 Unspecified abdominal pain: Secondary | ICD-10-CM | POA: Diagnosis not present

## 2018-10-21 NOTE — Patient Instructions (Signed)
Your health issues we discussed today were:   Right upper abdominal discomfort: 1. I will check what is called a "HIDA scan" to make sure your gallbladder is functioning normally. 2. A slow functioning gallbladder can call us discomfort such as what you are having 3. If this is normal, this is likely related to a musculoskeletal problem with heavy lifting at work 4. I recommend you try to avoid heavy lifting in the meantime 5. Call us if you have any worsening or severe symptoms.  Overall I recommend:  1. Return for follow-up in 3 months 2. Call us if you have any questions or concerns.   Because of recent events of COVID-19 ("Coronavirus"), follow CDC recommendations:  Wash your hand frequently Avoid touching your face Stay away from people who are sick If you have symptoms such as fever, cough, shortness of breath then call your healthcare provider for further guidance If you are sick, STAY AT HOME unless otherwise directed by your healthcare provider. Follow directions from state and national officials regarding staying safe   At [facility] we value your feedback. You may receive a survey about your visit today. Please share your experience as we strive to create trusting relationships with our patients to provide genuine, compassionate, quality care.  We appreciate your understanding and patience as we review any laboratory studies, imaging, and other diagnostic tests that are ordered as we care for you. Our office policy is 5 business days for review of these results, and any emergent or urgent results are addressed in a timely manner for your best interest. If you do not hear from our office in 1 week, please contact us.   We also encourage the use of MyChart, which contains your medical information for your review as well. If you are not enrolled in this feature, an access code is on this after visit summary for your convenience. Thank you for allowing [place] to be  involved in your care.  It was great to see you today!  I hope you have a great Fall!!

## 2018-10-21 NOTE — Progress Notes (Signed)
Primary Care Physician:  Renee Rival, NP Primary Gastroenterologist:  Dr. Gala Romney  Chief Complaint  Patient presents with  . Abdominal Pain    RUQ, comes/goes daily basis, dull pain, x 1 1/2 months    HPI:   Matthew Mullins is a 23 y.o. male who presents on referral from primary care for abdominal pain.  Reviewed information associated with referral including referral information which indicates right upper quadrant abdominal pain with a normal ultrasound.  Complete abdominal ultrasound dated 04/30/2018 found to be essentially normal.  Specifically no cholelithiasis or gallbladder wall thickening, CBD diameter 1 mm, normal parenchymal echogenicity of the liver and no focal lesion noted, normal pancreas, spleen size and appearance normal.  Right upper quadrant ultrasound completed 10/02/2018 found essentially normal study.  Specifically no gallstones or wall thickening, CBD diameter 2 mm, no focal liver lesions and within normal limits and parenchymal echogenicity.  Last labs in our system dated 04/28/2018 which found normal urinalysis, lipase, CBC, CMP.  Today he states he's doing ok overall. RUQ pain started 1.5 moths ago, gradual in onset. Described as "dull discomfort, doesn't really hurt." Occurs every day intermittently; possible triggers include heavy lifting at work. No worse after eating. Denies N/V, hematochezia, melena, fever, chills, unintentional weight loss. Denies GERD-type symptoms. Denies URI or flu-like symptoms. Denies loss of sense of taste or smell. Denies chest pain, dyspnea, dizziness, lightheadedness, syncope, near syncope. Denies any other upper or lower GI symptoms.  Not currently on any medications.  Past Medical History:  Diagnosis Date  . Acute appendicitis 12/01/2017  . Nephrolithiasis 11/2016   ER visit referred to urology    Past Surgical History:  Procedure Laterality Date  . LAPAROSCOPIC APPENDECTOMY N/A 12/01/2017   Procedure: APPENDECTOMY  LAPAROSCOPIC;  Surgeon: Aviva Signs, MD;  Location: [facility];  Service: General;  Laterality: N/A;    No current outpatient medications on file.   No current facility-administered medications for this visit.     Allergies as of 10/21/2018  . (No Known Allergies)    Family History  Problem Relation Age of Onset  . Arrhythmia Paternal Grandmother        prolonged QT  . Hypertension Paternal Grandmother   . Diabetes Paternal Grandmother   . COPD Paternal Grandfather   . CAD Other        great grandmother  . Stroke Other        great grandmother  . Cancer Other        paternal aunt with leukemia and breast  . Colon cancer Neg Hx     Social History   Socioeconomic History  . Marital status: Single    Spouse name: Not on file  . Number of children: Not on file  . Years of education: Not on file  . Highest education level: Not on file  Occupational History  . Not on file  Social Needs  . Financial resource strain: Not on file  . Food insecurity    Worry: Not on file    Inability: Not on file  . Transportation needs    Medical: Not on file    Non-medical: Not on file  Tobacco Use  . Smoking status: Former Research scientist (life sciences)  . Smokeless tobacco: Never Used  Substance and Sexual Activity  . Alcohol use: No  . Drug use: No  . Sexual activity: Not on file  Lifestyle  . Physical activity    Days per week: Not on file    Minutes  per session: Not on file  . Stress: Not on file  Relationships  . Social Musician on phone: Not on file    Gets together: Not on file    Attends religious service: Not on file    Active member of club or organization: Not on file    Attends meetings of clubs or organizations: Not on file    Relationship status: Not on file  . Intimate partner violence    Fear of current or ex partner: Not on file    Emotionally abused: Not on file    Physically abused: Not on file    Forced sexual activity: Not on file  Other Topics Concern  . Not on  file  Social History Narrative   Lives with father, step mother, 3 sisters, 2 dogs   Edu: [employer] 11th grade   Activity: JiuJitsu    Review of Systems: General: Negative for anorexia, weight loss, fever, chills, fatigue, weakness. ENT: Negative for hoarseness, difficulty swallowing. CV: Negative for chest pain, angina, palpitations, peripheral edema.  Respiratory: Negative for dyspnea at rest, cough, sputum, wheezing.  GI: See history of present illness. MS: Negative for joint pain, low back pain.  Derm: Negative for rash or itching.  Endo: Negative for unusual weight change.  Heme: Negative for bruising or bleeding. Allergy: Negative for rash or hives.    Physical Exam: BP 130/74   Pulse 67   Temp (!) 97.5 F (36.4 C) (Oral)   Ht 5\' 5"  (1.651 m)   Wt 118 lb 6.4 oz (53.7 kg)   BMI 19.70 kg/m  General:   Alert and oriented. Pleasant and cooperative. Well-nourished and well-developed.  Head:  Normocephalic and atraumatic. Eyes:  Without icterus, sclera clear and conjunctiva pink.  Ears:  Normal auditory acuity. Cardiovascular:  S1, S2 present without murmurs appreciated. Extremities without clubbing or edema. Respiratory:  Clear to auscultation bilaterally. No wheezes, rales, or rhonchi. No distress.  Gastrointestinal:  +BS, soft, non-tender and non-distended. No HSM noted. No guarding or rebound. No masses appreciated.  Rectal:  Deferred  Musculoskalatal:  Symmetrical without gross deformities. Neurologic:  Alert and oriented x4;  grossly normal neurologically. Psych:  Alert and cooperative. Normal mood and affect. Heme/Lymph/Immune: No excessive bruising noted.    10/21/2018 4:11 PM   Disclaimer: This note was dictated with voice recognition software. Similar sounding words can inadvertently be transcribed and may not be corrected upon review.

## 2018-10-21 NOTE — Assessment & Plan Note (Signed)
Right upper quadrant abdominal pain.  He denies actual pain and states it is more like a "discomfort" that comes and goes during the day.  Ultrasounds of all been normal, labs normal.  The only identified trigger he can think of is heavy lifting at work, he is a Dealer.  Given the location of his discomfort and frequency during today I will check a HIDA scan to be sure he does not have biliary dyskinesia.  Otherwise, likely musculoskeletal in nature.  We will have him follow-up in 2 months to check on his symptoms progress.  I will instruct him to call us if he has any worsening or severe symptoms.

## 2018-10-22 ENCOUNTER — Telehealth: Payer: Self-pay

## 2018-10-22 ENCOUNTER — Encounter: Payer: Self-pay | Admitting: Internal Medicine

## 2018-10-22 NOTE — Telephone Encounter (Signed)
HIDA scheduled for 10/29/18 at 8:00am, arrive at 7:45am. NPO after midnight before test and no pain med. Tried to call pt, no answer, left detailed message on VM to inform of appt. Letter mailed.

## 2018-10-22 NOTE — Telephone Encounter (Signed)
Called [facility], spoke to [place], no PA needed for HIDA scan.

## 2018-10-29 ENCOUNTER — Other Ambulatory Visit: Payer: Self-pay

## 2018-10-29 ENCOUNTER — Encounter (HOSPITAL_COMMUNITY): Payer: Self-pay

## 2018-10-29 ENCOUNTER — Encounter (HOSPITAL_COMMUNITY)
Admission: RE | Admit: 2018-10-29 | Discharge: 2018-10-29 | Disposition: A | Discharge status: Home / Self Care | Payer: 59 | Source: Ambulatory Visit | Attending: Nurse Practitioner | Admitting: Nurse Practitioner

## 2018-10-29 DIAGNOSIS — R1011 Right upper quadrant pain: Secondary | ICD-10-CM | POA: Insufficient documentation

## 2018-10-29 DIAGNOSIS — R109 Unspecified abdominal pain: Secondary | ICD-10-CM | POA: Insufficient documentation

## 2018-10-29 MED ORDER — TECHNETIUM TC 99M MEBROFENIN IV KIT
5.0000 | PACK | Freq: Once | INTRAVENOUS | Status: AC | PRN
  Administered 2018-10-29: 08:00:00 5 via INTRAVENOUS

## 2018-11-09 ENCOUNTER — Telehealth: Payer: Self-pay | Admitting: Internal Medicine

## 2018-11-09 NOTE — Telephone Encounter (Signed)
Routing to EG. Pt is inquiring about HIDA scan  results.

## 2018-11-09 NOTE — Telephone Encounter (Signed)
PATIENT CALLED WANTING RESULTS FROM HIS HIDA SCAN DONE 10/29/18

## 2018-11-09 NOTE — Telephone Encounter (Signed)
See result note.  

## 2018-11-10 NOTE — Telephone Encounter (Signed)
Noted  

## 2018-12-11 DIAGNOSIS — F17210 Nicotine dependence, cigarettes, uncomplicated: Secondary | ICD-10-CM | POA: Diagnosis not present

## 2018-12-11 DIAGNOSIS — F329 Major depressive disorder, single episode, unspecified: Secondary | ICD-10-CM | POA: Diagnosis not present

## 2018-12-11 DIAGNOSIS — F332 Major depressive disorder, recurrent severe without psychotic features: Secondary | ICD-10-CM | POA: Diagnosis not present

## 2018-12-11 DIAGNOSIS — F1290 Cannabis use, unspecified, uncomplicated: Secondary | ICD-10-CM | POA: Diagnosis not present

## 2018-12-11 DIAGNOSIS — Z20828 Contact with and (suspected) exposure to other viral communicable diseases: Secondary | ICD-10-CM | POA: Diagnosis not present

## 2018-12-11 DIAGNOSIS — R45851 Suicidal ideations: Secondary | ICD-10-CM | POA: Diagnosis not present

## 2018-12-11 DIAGNOSIS — F419 Anxiety disorder, unspecified: Secondary | ICD-10-CM | POA: Diagnosis not present

## 2018-12-11 DIAGNOSIS — H9319 Tinnitus, unspecified ear: Secondary | ICD-10-CM | POA: Diagnosis not present

## 2018-12-21 DIAGNOSIS — F322 Major depressive disorder, single episode, severe without psychotic features: Secondary | ICD-10-CM | POA: Diagnosis not present

## 2018-12-23 ENCOUNTER — Telehealth: Payer: Self-pay | Admitting: Internal Medicine

## 2018-12-23 ENCOUNTER — Ambulatory Visit: Payer: 59 | Admitting: Nurse Practitioner

## 2018-12-23 ENCOUNTER — Encounter: Payer: Self-pay | Admitting: Internal Medicine

## 2018-12-23 NOTE — Progress Notes (Deleted)
Referring Provider: Erasmo Downer, NP Primary Care Physician:  Erasmo Downer, NP Primary GI:  Dr. Jena Gauss  No chief complaint on file.   HPI:   Matthew Mullins is a 23 y.o. male who presents for follow-up on right upper quadrant pain.  The patient was last seen in our office 10/21/2018 for right-sided abdominal pain and right upper quadrant pain.  Complete abdominal ultrasound dated 04/30/2018 found to be normal.  Specifically no cholelithiasis or gallbladder wall thickening, CBD diameter 1 mm, normal parenchymal echogenicity of the liver and no focal lesion identified, normal pancreas, spleen size and appearance normal.  Follow-up right upper quadrant ultrasound 10/02/2018 essentially a normal study.  Updated labs including normal urinalysis, lipase, CBC, CMP  At his last visit he noted right upper quadrant pain that started 1-1/2 months prior which is gradual in onset and described as "dull discomfort, does not really hurt."  Occurs daily intermittently with possible triggers including heavy lifting, no association with food.  Denies GERD type symptoms specifically.  No other overt GI complaints.  Recommended HIDA scan, consider musculoskeletal etiology, avoid heavy lifting in the meantime, follow-up in 3 months.  HIDA scan completed 10/29/2018 was found to be normal with a calculated gallbladder EF of 84%.  No symptoms with Ensure ingestion.  Today he states   Past Medical History:  Diagnosis Date  . Acute appendicitis 12/01/2017  . Nephrolithiasis 11/2016   ER visit referred to urology    Past Surgical History:  Procedure Laterality Date  . LAPAROSCOPIC APPENDECTOMY N/A 12/01/2017   Procedure: APPENDECTOMY LAPAROSCOPIC;  Surgeon: Franky Macho, MD;  Location: [facility];  Service: General;  Laterality: N/A;    No current outpatient medications on file.   No current facility-administered medications for this visit.    Allergies as of 12/23/2018  . (No Known Allergies)    Family History  Problem Relation Age of Onset  . Arrhythmia Paternal Grandmother        prolonged QT  . Hypertension Paternal Grandmother   . Diabetes Paternal Grandmother   . COPD Paternal Grandfather   . CAD Other        great grandmother  . Stroke Other        great grandmother  . Cancer Other        paternal aunt with leukemia and breast  . Colon cancer Neg Hx     Social History   Socioeconomic History  . Marital status: Single    Spouse name: Not on file  . Number of children: Not on file  . Years of education: Not on file  . Highest education level: Not on file  Occupational History  . Not on file  Tobacco Use  . Smoking status: Former Games developer  . Smokeless tobacco: Never Used  Substance and Sexual Activity  . Alcohol use: No  . Drug use: No  . Sexual activity: Not on file  Other Topics Concern  . Not on file  Social History Narrative   Lives with father, step mother, 3 sisters, 2 dogs   Edu: [employer] 11th grade   Activity: JiuJitsu   Social Determinants of Health   Financial Resource Strain:   . Difficulty of Paying Living Expenses: Not on file  Food Insecurity:   . Worried About Programme researcher, broadcasting/film/video in the Last Year: Not on file  . Ran Out of Food in the Last Year: Not on file  Transportation Needs:   . Lack of  Transportation (Medical): Not on file  . Lack of Transportation (Non-Medical): Not on file  Physical Activity:   . Days of Exercise per Week: Not on file  . Minutes of Exercise per Session: Not on file  Stress:   . Feeling of Stress : Not on file  Social Connections:   . Frequency of Communication with Friends and Family: Not on file  . Frequency of Social Gatherings with Friends and Family: Not on file  . Attends Religious Services: Not on file  . Active Member of Clubs or Organizations: Not on file  . Attends Archivist Meetings: Not on file  . Marital Status: Not on file    Review of Systems: General: Negative for  anorexia, weight loss, fever, chills, fatigue, weakness. Eyes: Negative for vision changes.  ENT: Negative for hoarseness, difficulty swallowing , nasal congestion. CV: Negative for chest pain, angina, palpitations, dyspnea on exertion, peripheral edema.  Respiratory: Negative for dyspnea at rest, dyspnea on exertion, cough, sputum, wheezing.  GI: See history of present illness. GU:  Negative for dysuria, hematuria, urinary incontinence, urinary frequency, nocturnal urination.  MS: Negative for joint pain, low back pain.  Derm: Negative for rash or itching.  Neuro: Negative for weakness, abnormal sensation, seizure, frequent headaches, memory loss, confusion.  Psych: Negative for anxiety, depression, suicidal ideation, hallucinations.  Endo: Negative for unusual weight change.  Heme: Negative for bruising or bleeding. Allergy: Negative for rash or hives.   Physical Exam: There were no vitals taken for this visit. General:   Alert and oriented. Pleasant and cooperative. Well-nourished and well-developed.  Head:  Normocephalic and atraumatic. Eyes:  Without icterus, sclera clear and conjunctiva pink.  Ears:  Normal auditory acuity. Mouth:  No deformity or lesions, oral mucosa pink.  Throat/Neck:  Supple, without mass or thyromegaly. Cardiovascular:  S1, S2 present without murmurs appreciated. Normal pulses noted. Extremities without clubbing or edema. Respiratory:  Clear to auscultation bilaterally. No wheezes, rales, or rhonchi. No distress.  Gastrointestinal:  +BS, soft, non-tender and non-distended. No HSM noted. No guarding or rebound. No masses appreciated.  Rectal:  Deferred  Musculoskalatal:  Symmetrical without gross deformities. Normal posture. Skin:  Intact without significant lesions or rashes. Neurologic:  Alert and oriented x4;  grossly normal neurologically. Psych:  Alert and cooperative. Normal mood and affect. Heme/Lymph/Immune: No significant cervical adenopathy. No  excessive bruising noted.    12/23/2018 8:15 AM   Disclaimer: This note was dictated with voice recognition software. Similar sounding words can inadvertently be transcribed and may not be corrected upon review.

## 2018-12-23 NOTE — Telephone Encounter (Signed)
Patient was a no show and letter sent  °

## 2018-12-29 DIAGNOSIS — F322 Major depressive disorder, single episode, severe without psychotic features: Secondary | ICD-10-CM | POA: Diagnosis not present

## 2019-01-21 ENCOUNTER — Other Ambulatory Visit: Payer: Self-pay | Admitting: Otolaryngology

## 2019-01-21 DIAGNOSIS — H9072 Mixed conductive and sensorineural hearing loss, unilateral, left ear, with unrestricted hearing on the contralateral side: Secondary | ICD-10-CM | POA: Diagnosis not present

## 2019-01-21 DIAGNOSIS — H9312 Tinnitus, left ear: Secondary | ICD-10-CM | POA: Diagnosis not present

## 2019-01-26 ENCOUNTER — Ambulatory Visit: Payer: 59

## 2019-01-29 ENCOUNTER — Other Ambulatory Visit: Payer: Self-pay

## 2019-01-29 ENCOUNTER — Ambulatory Visit
Admission: RE | Admit: 2019-01-29 | Discharge: 2019-01-29 | Disposition: A | Discharge status: Home / Self Care | Payer: 59 | Source: Ambulatory Visit | Attending: Otolaryngology | Admitting: Otolaryngology

## 2019-01-29 DIAGNOSIS — H9192 Unspecified hearing loss, left ear: Secondary | ICD-10-CM | POA: Diagnosis not present

## 2019-01-29 DIAGNOSIS — H9072 Mixed conductive and sensorineural hearing loss, unilateral, left ear, with unrestricted hearing on the contralateral side: Secondary | ICD-10-CM | POA: Diagnosis not present

## 2019-01-29 DIAGNOSIS — H9312 Tinnitus, left ear: Secondary | ICD-10-CM | POA: Diagnosis not present

## 2019-02-04 DIAGNOSIS — H6982 Other specified disorders of Eustachian tube, left ear: Secondary | ICD-10-CM | POA: Diagnosis not present

## 2019-02-04 DIAGNOSIS — H9072 Mixed conductive and sensorineural hearing loss, unilateral, left ear, with unrestricted hearing on the contralateral side: Secondary | ICD-10-CM | POA: Diagnosis not present

## 2019-02-17 ENCOUNTER — Other Ambulatory Visit
Admission: RE | Admit: 2019-02-17 | Discharge: 2019-02-17 | Disposition: A | Discharge status: Home / Self Care | Payer: 59 | Source: Ambulatory Visit | Attending: Otolaryngology | Admitting: Otolaryngology

## 2019-02-17 ENCOUNTER — Other Ambulatory Visit: Payer: Self-pay

## 2019-02-17 ENCOUNTER — Encounter: Payer: Self-pay | Admitting: Otolaryngology

## 2019-02-17 DIAGNOSIS — Z20822 Contact with and (suspected) exposure to covid-19: Secondary | ICD-10-CM | POA: Diagnosis not present

## 2019-02-17 DIAGNOSIS — Z01812 Encounter for preprocedural laboratory examination: Secondary | ICD-10-CM | POA: Diagnosis not present

## 2019-02-17 LAB — SARS CORONAVIRUS 2 (TAT 6-24 HRS): SARS Coronavirus 2: NEGATIVE

## 2019-02-18 NOTE — Discharge Instructions (Signed)
[facility] °DISCHARGE INSTRUCTIONS FOR MYRINGOTOMY AND TUBE INSERTION ° °[facility] °PAUL JUENGEL, M.D. °CHAPMAN T. MCQUEEN, M.D. °SCOTT BENNETT, M.D. °CREIGHTON VAUGHT, M.D. ° °Diet:   After surgery, the patient should take only liquids and foods as tolerated.  The patient may then have a regular diet after the effects of anesthesia have worn off, usually about four to six hours after surgery. ° °Activities:   The patient should rest until the effects of anesthesia have worn off.  After this, there are no restrictions on the normal daily activities. ° °Medications:   You will be given antibiotic drops to be used in the ears postoperatively.  It is recommended to use 4 drops 2 times a day for 4 days, then the drops should be saved for possible future use. ° °The tubes should not cause any discomfort to the patient, but if there is any question, Tylenol should be given according to the instructions for the age of the patient. ° °Other medications should be continued normally. ° °Precautions:   Should there be recurrent drainage after the tubes are placed, the drops should be used for approximately 3-4 days.  If it does not clear, you should call the ENT office. ° °Earplugs:   Earplugs are only needed for those who are going to be submerged under water.  When taking a bath or shower and using a cup or showerhead to rinse hair, it is not necessary to wear earplugs.  These come in a variety of fashions, all of which can be obtained at our office.  However, if one is not able to come by the office, then silicone plugs can be found at most pharmacies.  It is not advised to stick anything in the ear that is not approved as an earplug.  Silly putty is not to be used as an earplug.  Swimming is allowed in patients after ear tubes are inserted, however, they must wear earplugs if they are going to be submerged under water.  For those children who are going to be swimming a lot, it is  recommended to use a fitted ear mold, which can be made by our audiologist.  If discharge is noticed from the ears, this most likely represents an ear infection.  We would recommend getting your eardrops and using them as indicated above.  If it does not clear, then you should call the ENT office.  For follow up, the patient should return to the ENT office three weeks postoperatively and then every six months as required by the doctor. ° ° °General Anesthesia, Adult, Care After °This sheet gives you information about how to care for yourself after your procedure. Your health care provider may also give you more specific instructions. If you have problems or questions, contact your health care provider. °What can I expect after the procedure? °After the procedure, the following side effects are common: °· Pain or discomfort at the IV site. °· Nausea. °· Vomiting. °· Sore throat. °· Trouble concentrating. °· Feeling cold or chills. °· Weak or tired. °· Sleepiness and fatigue. °· Soreness and body aches. These side effects can affect parts of the body that were not involved in surgery. °Follow these instructions at home: ° °For at least 24 hours after the procedure: °· Have a responsible adult stay with you. It is important to have someone help care for you until you are awake and alert. °· Rest as needed. °· Do not: °? Participate in   in which you could fall or become injured. °? Drive. °? Use heavy machinery. °? Drink alcohol. °? Take sleeping pills or medicines that cause drowsiness. °? Make important decisions or sign legal documents. °? Take care of children on your own. °Eating and drinking °· Follow any instructions from your health care provider about eating or drinking restrictions. °· When you feel hungry, start by eating small amounts of foods that are soft and easy to digest (bland), such as toast. Gradually return to your regular diet. °· Drink enough fluid to keep your urine pale yellow. °· If you  vomit, rehydrate by drinking water, juice, or clear broth. °General instructions °· If you have sleep apnea, surgery and certain medicines can increase your risk for breathing problems. Follow instructions from your health care provider about wearing your sleep device: °? Anytime you are sleeping, including during daytime naps. °? While taking prescription pain medicines, sleeping medicines, or medicines that make you drowsy. °· Return to your normal activities as told by your health care provider. Ask your health care provider what activities are safe for you. °· Take over-the-counter and prescription medicines only as told by your health care provider. °· If you smoke, do not smoke without supervision. °· Keep all follow-up visits as told by your health care provider. This is important. °Contact a health care provider if: °· You have nausea or vomiting that does not get better with medicine. °· You cannot eat or drink without vomiting. °· You have pain that does not get better with medicine. °· You are unable to pass urine. °· You develop a skin rash. °· You have a fever. °· You have redness around your IV site that gets worse. °Get help right away if: °· You have difficulty breathing. °· You have chest pain. °· You have blood in your urine or stool, or you vomit blood. °Summary °· After the procedure, it is common to have a sore throat or nausea. It is also common to feel tired. °· Have a responsible adult stay with you for the first 24 hours after general anesthesia. It is important to have someone help care for you until you are awake and alert. °· When you feel hungry, start by eating small amounts of foods that are soft and easy to digest (bland), such as toast. Gradually return to your regular diet. °· Drink enough fluid to keep your urine pale yellow. °· Return to your normal activities as told by your health care provider. Ask your health care provider what activities are safe for you. °This information is  not intended to replace advice given to you by your health care provider. Make sure you discuss any questions you have with your health care provider. °Document Revised: 12/27/2016 Document Reviewed: 08/09/2016 °Elsevier Patient Education © 2020 [employer] ° °

## 2019-02-19 ENCOUNTER — Ambulatory Visit
Admission: RE | Admit: 2019-02-19 | Discharge: 2019-02-19 | Disposition: A | Discharge status: Home / Self Care | Payer: 59 | Attending: Otolaryngology | Admitting: Otolaryngology

## 2019-02-19 ENCOUNTER — Other Ambulatory Visit: Payer: Self-pay

## 2019-02-19 ENCOUNTER — Ambulatory Visit: Payer: 59 | Admitting: Anesthesiology

## 2019-02-19 ENCOUNTER — Encounter: Payer: Self-pay | Admitting: Otolaryngology

## 2019-02-19 ENCOUNTER — Encounter
Admission: RE | Disposition: A | Discharge status: Home / Self Care | Payer: Self-pay | Source: Home / Self Care | Attending: Otolaryngology

## 2019-02-19 DIAGNOSIS — H9072 Mixed conductive and sensorineural hearing loss, unilateral, left ear, with unrestricted hearing on the contralateral side: Secondary | ICD-10-CM | POA: Diagnosis not present

## 2019-02-19 DIAGNOSIS — F17200 Nicotine dependence, unspecified, uncomplicated: Secondary | ICD-10-CM | POA: Insufficient documentation

## 2019-02-19 DIAGNOSIS — H748X2 Other specified disorders of left middle ear and mastoid: Secondary | ICD-10-CM | POA: Insufficient documentation

## 2019-02-19 DIAGNOSIS — H9190 Unspecified hearing loss, unspecified ear: Secondary | ICD-10-CM | POA: Diagnosis present

## 2019-02-19 DIAGNOSIS — F329 Major depressive disorder, single episode, unspecified: Secondary | ICD-10-CM | POA: Diagnosis not present

## 2019-02-19 DIAGNOSIS — H9012 Conductive hearing loss, unilateral, left ear, with unrestricted hearing on the contralateral side: Secondary | ICD-10-CM | POA: Diagnosis not present

## 2019-02-19 DIAGNOSIS — H6983 Other specified disorders of Eustachian tube, bilateral: Secondary | ICD-10-CM | POA: Diagnosis not present

## 2019-02-19 DIAGNOSIS — Z79899 Other long term (current) drug therapy: Secondary | ICD-10-CM | POA: Insufficient documentation

## 2019-02-19 HISTORY — PX: MYRINGOTOMY WITH TUBE PLACEMENT: SHX5663

## 2019-02-19 SURGERY — MYRINGOTOMY WITH TUBE PLACEMENT
Anesthesia: General | Site: Ear | Laterality: Left

## 2019-02-19 MED ORDER — PROPOFOL 10 MG/ML IV BOLUS
INTRAVENOUS | Status: DC | PRN
  Administered 2019-02-19: 80 mg via INTRAVENOUS

## 2019-02-19 MED ORDER — LACTATED RINGERS IV SOLN: INTRAVENOUS | Status: DC

## 2019-02-19 MED ORDER — MIDAZOLAM HCL 5 MG/5ML IJ SOLN
INTRAMUSCULAR | Status: DC | PRN
  Administered 2019-02-19: 2 mg via INTRAVENOUS

## 2019-02-19 MED ORDER — CIPROFLOXACIN-DEXAMETHASONE 0.3-0.1 % OT SUSP
OTIC | Status: DC | PRN
  Administered 2019-02-19: 4 [drp] via OTIC

## 2019-02-19 MED ORDER — ONDANSETRON HCL 4 MG/2ML IJ SOLN
INTRAMUSCULAR | Status: DC | PRN
  Administered 2019-02-19: 4 mg via INTRAVENOUS

## 2019-02-19 MED ORDER — DEXAMETHASONE SODIUM PHOSPHATE 4 MG/ML IJ SOLN
INTRAMUSCULAR | Status: DC | PRN
  Administered 2019-02-19: 8 mg via INTRAVENOUS

## 2019-02-19 MED ORDER — GLYCOPYRROLATE 0.2 MG/ML IJ SOLN
INTRAMUSCULAR | Status: DC | PRN
  Administered 2019-02-19: .1 mg via INTRAVENOUS

## 2019-02-19 MED ORDER — LIDOCAINE HCL (CARDIAC) PF 100 MG/5ML IV SOSY
PREFILLED_SYRINGE | INTRAVENOUS | Status: DC | PRN
  Administered 2019-02-19: 40 mg via INTRAVENOUS

## 2019-02-19 SURGICAL SUPPLY — 11 items
BLADE MYR LANCE NRW W/HDL (BLADE) ×3 IMPLANT
CANISTER SUCT 1200ML W/VALVE (MISCELLANEOUS) ×3 IMPLANT
COTTONBALL LRG STERILE PKG (GAUZE/BANDAGES/DRESSINGS) ×3 IMPLANT
GLOVE BIO SURGEON STRL SZ7.5 (GLOVE) ×5 IMPLANT
STRAP BODY AND KNEE 60X3 (MISCELLANEOUS) ×3 IMPLANT
TOWEL OR 17X26 4PK STRL BLUE (TOWEL DISPOSABLE) ×3 IMPLANT
TUBE EAR ARMSTRONG HC 1.14X3.5 (OTOLOGIC RELATED) ×4 IMPLANT
TUBE EAR T 1.27X4.5 GO LF (OTOLOGIC RELATED) IMPLANT
TUBE EAR T 1.27X5.3 BFLY (OTOLOGIC RELATED) IMPLANT
TUBING CONN 6MMX3.1M (TUBING) ×2
TUBING SUCTION CONN 0.25 STRL (TUBING) ×1 IMPLANT

## 2019-02-19 NOTE — Anesthesia Postprocedure Evaluation (Signed)
Anesthesia Post Note  Patient: Matthew Mullins  Procedure(s) Performed: MYRINGOTOMY WITH TUBE PLACEMENT (Left Ear)     Patient location during evaluation: PACU Anesthesia Type: General Level of consciousness: awake and alert Pain management: pain level controlled Vital Signs Assessment: post-procedure vital signs reviewed and stable Respiratory status: spontaneous breathing, nonlabored ventilation, respiratory function stable and patient connected to nasal cannula oxygen Cardiovascular status: blood pressure returned to baseline and stable Postop Assessment: no apparent nausea or vomiting Anesthetic complications: no    Wille Celeste Kiesha Ensey

## 2019-02-19 NOTE — Anesthesia Preprocedure Evaluation (Signed)
Anesthesia Evaluation  Patient identified by MRN, date of birth, ID band Patient awake    History of Anesthesia Complications Negative for: history of anesthetic complications  Airway Mallampati: II  TM Distance: >3 FB Neck ROM: Full    Dental no notable dental hx.    Pulmonary neg pulmonary ROS, Current Smoker and Patient abstained from smoking.,    Pulmonary exam normal        Cardiovascular Exercise Tolerance: Good negative cardio ROS Normal cardiovascular exam     Neuro/Psych Depression negative neurological ROS     GI/Hepatic negative GI ROS, Neg liver ROS,   Endo/Other  negative endocrine ROS  Renal/GU negative Renal ROS     Musculoskeletal negative musculoskeletal ROS (+)   Abdominal   Peds  Hematology negative hematology ROS (+)   Anesthesia Other Findings   Reproductive/Obstetrics                             Anesthesia Physical Anesthesia Plan  ASA: II  Anesthesia Plan: General   Post-op Pain Management:    Induction: Intravenous  PONV Risk Score and Plan: 1 and Propofol infusion, Midazolam, Ondansetron and Treatment may vary due to age or medical condition  Airway Management Planned: Natural Airway  Additional Equipment: None  Intra-op Plan:   Post-operative Plan:   Informed Consent: I have reviewed the patients History and Physical, chart, labs and discussed the procedure including the risks, benefits and alternatives for the proposed anesthesia with the patient or authorized representative who has indicated his/her understanding and acceptance.       Plan Discussed with: CRNA  Anesthesia Plan Comments:         Anesthesia Quick Evaluation

## 2019-02-19 NOTE — Transfer of Care (Signed)
Immediate Anesthesia Transfer of Care Note  Patient: Matthew Mullins  Procedure(s) Performed: MYRINGOTOMY WITH TUBE PLACEMENT (Left Ear)  Patient Location: PACU  Anesthesia Type: General  Level of Consciousness: awake, alert  and patient cooperative  Airway and Oxygen Therapy: Patient Spontanous Breathing and Patient connected to supplemental oxygen  Post-op Assessment: Post-op Vital signs reviewed, Patient's Cardiovascular Status Stable, Respiratory Function Stable, Patent Airway and No signs of Nausea or vomiting  Post-op Vital Signs: Reviewed and stable  Complications: No apparent anesthesia complications

## 2019-02-19 NOTE — Op Note (Signed)
..  02/19/2019  7:40 AM    Marily Lente  [id number]   Pre-Op Dx:  hearing loss  Post-op Dx: hearing loss  Proc: Left myringotomy with tubes  Surg: Shonna Deiter  Anes:  General by mask  EBL:  None  Comp:  None  Findings:  Left myringotomy placed in anterior inferior aspect with significant serous fluid in middle ear space  Procedure: With the patient in a comfortable supine position, general mask anesthesia was administered.  At an appropriate level, microscope and speculum were used to examine and clean the LEFT ear canal.  The findings were as described above.  An anterior inferior radial myringotomy incision was sharply executed.  Middle ear contents were suctioned clear with a size 5 otologic suction.  A PE tube was placed without difficulty using a Rosen pick and Facilities manager.  Ciprodex otic solution was instilled into the external canal, and insufflated into the middle ear.  A cotton ball was placed at the external meatus. Hemostasis was observed.  This side was completed.  Following this  The patient was returned to anesthesia, awakened, and transferred to recovery in stable condition.  Dispo:  PACU to home  Plan: Routine drop use and water precautions.  Recheck my office three weeks.   Roney Mans Susano Cleckler 7:40 AM 02/19/2019

## 2019-02-19 NOTE — Anesthesia Procedure Notes (Signed)
Procedure Name: General with mask airway Performed by: Chanteria Haggard, CRNA Pre-anesthesia Checklist: Patient identified, Patient being monitored, Emergency Drugs available, Timeout performed and Suction available Patient Re-evaluated:Patient Re-evaluated prior to induction Oxygen Delivery Method: Circle system utilized Preoxygenation: Pre-oxygenation with 100% oxygen Induction Type: Combination inhalational/ intravenous induction Ventilation: Mask ventilation without difficulty Dental Injury: Teeth and Oropharynx as per pre-operative assessment        

## 2019-02-19 NOTE — H&P (Signed)
..  History and Physical paper copy reviewed and updated date of procedure and will be scanned into system.  Patient seen and examined and marked for his left ear.

## 2019-02-22 ENCOUNTER — Encounter: Payer: Self-pay | Admitting: *Deleted

## 2019-03-23 DIAGNOSIS — H6982 Other specified disorders of Eustachian tube, left ear: Secondary | ICD-10-CM | POA: Diagnosis not present

## 2020-02-13 IMAGING — NM NM HEPATO W/GB/PHARM/[PERSON_NAME]
2 series · 12 of 12 positions shown · non-contrast
Comparison: None

CLINICAL DATA: Six weeks history of RIGHT upper quadrant pain,
without nausea, vomiting, or fever

EXAM:
NUCLEAR MEDICINE HEPATOBILIARY IMAGING WITH GALLBLADDER EF
TECHNIQUE: Sequential images of the abdomen were obtained [DATE] minutes
following intravenous administration of radiopharmaceutical. After
oral ingestion of Ensure, gallbladder ejection fraction was
determined. At 60 min, normal ejection fraction is greater than 33%.
RADIOPHARMACEUTICALS:  5 mCi Cc-EEm  Choletec IV

[Series 1: biliary · 3.25mm/px · 6 of 60 frames shown]
[frame 6/60]
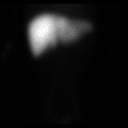
[frame 16/60]
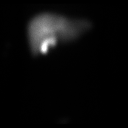
[frame 26/60]
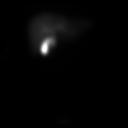
[frame 36/60]
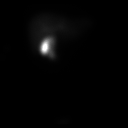
[frame 46/60]
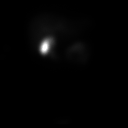
[frame 56/60]
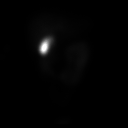

[Series 2: gbef · 3.25mm/px · 6 of 60 frames shown]
[frame 6/60]
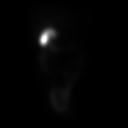
[frame 16/60]
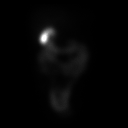
[frame 26/60]
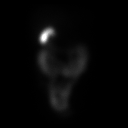
[frame 36/60]
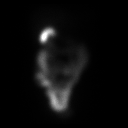
[frame 46/60]
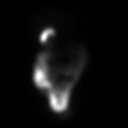
[frame 56/60]
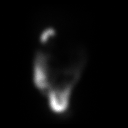

[12 of 12 positions shown; findings below may reference images not displayed]

FINDINGS: Normal tracer extraction from bloodstream indicating normal
hepatocellular function.

Normal excretion of tracer into biliary tree.

Gallbladder visualized at 13 min.

Small bowel visualized at 42 min.

No hepatic retention of tracer.

Subjectively normal emptying of tracer from gallbladder following
fatty meal stimulation.

Calculated gallbladder ejection fraction is 84%, normal.

Patient reported no symptoms following Ensure ingestion.

Normal gallbladder ejection fraction following Ensure ingestion is
greater than 33% at 1 hour.
IMPRESSION: Normal exam.

## 2020-04-11 ENCOUNTER — Emergency Department
Admission: EM | Admit: 2020-04-11 | Discharge: 2020-04-11 | Disposition: A | Discharge status: Home / Self Care | Payer: 59 | Attending: Emergency Medicine | Admitting: Emergency Medicine

## 2020-04-11 ENCOUNTER — Other Ambulatory Visit: Payer: Self-pay

## 2020-04-11 DIAGNOSIS — F410 Panic disorder [episodic paroxysmal anxiety] without agoraphobia: Secondary | ICD-10-CM

## 2020-04-11 DIAGNOSIS — F17200 Nicotine dependence, unspecified, uncomplicated: Secondary | ICD-10-CM | POA: Insufficient documentation

## 2020-04-11 DIAGNOSIS — F419 Anxiety disorder, unspecified: Secondary | ICD-10-CM | POA: Diagnosis not present

## 2020-04-11 DIAGNOSIS — R17 Unspecified jaundice: Secondary | ICD-10-CM

## 2020-04-11 DIAGNOSIS — F331 Major depressive disorder, recurrent, moderate: Secondary | ICD-10-CM | POA: Diagnosis not present

## 2020-04-11 DIAGNOSIS — R748 Abnormal levels of other serum enzymes: Secondary | ICD-10-CM | POA: Diagnosis not present

## 2020-04-11 DIAGNOSIS — F4010 Social phobia, unspecified: Secondary | ICD-10-CM

## 2020-04-11 LAB — CBC WITH DIFFERENTIAL/PLATELET
Abs Immature Granulocytes: 0.02 10*3/uL (ref 0.00–0.07)
Basophils Absolute: 0 10*3/uL (ref 0.0–0.1)
Basophils Relative: 1 %
Eosinophils Absolute: 0 10*3/uL (ref 0.0–0.5)
Eosinophils Relative: 0 %
HCT: 48.5 % (ref 39.0–52.0)
Hemoglobin: 17 g/dL (ref 13.0–17.0)
Immature Granulocytes: 0 %
Lymphocytes Relative: 16 %
Lymphs Abs: 1.3 10*3/uL (ref 0.7–4.0)
MCH: 31.7 pg (ref 26.0–34.0)
MCHC: 35.1 g/dL (ref 30.0–36.0)
MCV: 90.5 fL (ref 80.0–100.0)
Monocytes Absolute: 0.6 10*3/uL (ref 0.1–1.0)
Monocytes Relative: 7 %
Neutro Abs: 6.1 10*3/uL (ref 1.7–7.7)
Neutrophils Relative %: 76 %
Platelets: 255 10*3/uL (ref 150–400)
RBC: 5.36 MIL/uL (ref 4.22–5.81)
RDW: 12.3 % (ref 11.5–15.5)
WBC: 8 10*3/uL (ref 4.0–10.5)
nRBC: 0 % (ref 0.0–0.2)

## 2020-04-11 LAB — COMPREHENSIVE METABOLIC PANEL
ALT: 24 U/L (ref 0–44)
AST: 33 U/L (ref 15–41)
Albumin: 5.2 g/dL — ABNORMAL HIGH (ref 3.5–5.0)
Alkaline Phosphatase: 67 U/L (ref 38–126)
Anion gap: 11 (ref 5–15)
BUN: 16 mg/dL (ref 6–20)
CO2: 23 mmol/L (ref 22–32)
Calcium: 9.4 mg/dL (ref 8.9–10.3)
Chloride: 103 mmol/L (ref 98–111)
Creatinine, Ser: 1.05 mg/dL (ref 0.61–1.24)
GFR, Estimated: >60 mL/min (ref >60)
Glucose, Bld: 87 mg/dL (ref 70–99)
Potassium: 3.4 mmol/L — ABNORMAL LOW (ref 3.5–5.1)
Sodium: 137 mmol/L (ref 135–145)
Total Bilirubin: 1.7 mg/dL — ABNORMAL HIGH (ref 0.3–1.2)
Total Protein: 8.3 g/dL — ABNORMAL HIGH (ref 6.5–8.1)

## 2020-04-11 LAB — ETHANOL: Alcohol, Ethyl (B): <10 mg/dL (ref <10)

## 2020-04-11 LAB — ACETAMINOPHEN LEVEL: Acetaminophen (Tylenol), Serum: <10 ug/mL — ABNORMAL LOW (ref 10–30)

## 2020-04-11 LAB — SALICYLATE LEVEL: Salicylate Lvl: <7 mg/dL — ABNORMAL LOW (ref 7.0–30.0)

## 2020-04-11 MED ORDER — FLUOXETINE HCL 20 MG PO CAPS: 20.0000 mg | ORAL_CAPSULE | Freq: Every day | ORAL | 2 refills | Status: AC

## 2020-04-11 MED ORDER — POTASSIUM CHLORIDE CRYS ER 20 MEQ PO TBCR
40.0000 meq | EXTENDED_RELEASE_TABLET | Freq: Once | ORAL | Status: AC
  Administered 2020-04-11: 40 meq via ORAL
  Filled 2020-04-11: qty 2

## 2020-04-11 NOTE — BH Assessment (Signed)
Comprehensive Clinical Assessment (CCA) Screening, Triage and Referral Note  04/11/2020 Matthew Mullins [id number]    Matthew Mullins is an 25 y.o male who presents to [facility] ED voluntarily for treatment. Per triage note, Pt comes with c/o feeling depressed and anxiety. Pt states he got into argument with his mom today. Pt states his mom told him to come here and get help. Pt states he just feels angry. Pt states no drug or alcohol use. Pt denies any SI or HI. Pt tearful in triage. Pt states he just needs some help. Pt states hard time sleeping and not really eating.  Provider HPI: Patient presents to [facility] ED because of worsening symptoms of depression and anxiety.  Patient says his mood feels sad and down most of the time.  Certainly been bad pretty consistently for a few months.  Often sleeps poorly at night.  Not eating well.  Also has frequent anxiety possibly almost constant anxiety but in particular he mentions having anxiety in any kind of social situation.  Also endorses panic attacks.  Patient denies any suicidal thought intent or plan.  Denies any psychotic symptoms.  He says he has occasionally used marijuana but estimates its usage at about once every 2 weeks.  Not using any other drugs or alcohol.  No homicidal ideation.  Not currently getting any treatment.  He had a hospitalization about a year ago by his report but did not follow-up with outpatient treatment.  Per Dr. Toni Amend pt does not meet criteria for INPT  Chief Complaint:  Chief Complaint  Patient presents with  . Anxiety  . Depression   Visit Diagnosis: MDD, moderate, recurrent  Patient Reported Information How did you hear about [place]? Self   Referral name: self   Referral phone number: No data recorded Whom do you see for routine medical problems? I don't have a doctor   Practice/Facility Name: No data recorded  Practice/Facility Phone Number: No data recorded  Name of Contact: No data recorded  Contact Number: No data  recorded  Contact Fax Number: No data recorded  Prescriber Name: No data recorded  Prescriber Address (if known): No data recorded What Is the Reason for Your Visit/Call Today? Anxiety & depression  How Long Has This Been Causing You Problems? <Week  Have You Recently Been in Any Inpatient Treatment (Hospital/Detox/Crisis Center/28-Day Program)? No   Name/Location of Program/Hospital:No data recorded  How Long Were You There? No data recorded  When Were You Discharged? No data recorded Have You Ever Received Services From [facility] Before? No   Who Do You See at [facility] No data recorded Have You Recently Had Any Thoughts About Hurting Yourself? No   Are You Planning to Commit Suicide/Harm Yourself At This time?  No  Have you Recently Had Thoughts About Hurting Someone Karolee Ohs? No   Explanation: No data recorded Have You Used Any Alcohol or Drugs in the Past 24 Hours? No   How Long Ago Did You Use Drugs or Alcohol?  No data recorded  What Did You Use and How Much? No data recorded What Do You Feel Would Help You the Most Today? Medication(s); Treatment for Depression or other mood problem  Do You Currently Have a Therapist/Psychiatrist? No   Name of Therapist/Psychiatrist: No data recorded  Have You Been Recently Discharged From Any Office Practice or Programs? No   Explanation of Discharge From Practice/Program:  No data recorded    CCA Screening Triage Referral Assessment Type of Contact: Face-to-Face  Is this Initial or Reassessment? No data recorded  Date Telepsych consult ordered in [facility]:  No data recorded  Time Telepsych consult ordered in [facility]:  No data recorded Patient Reported Information Reviewed? Yes   Patient Left Without Being Seen? No data recorded  Reason for Not Completing Assessment: No data recorded Collateral Involvement: None provided  Does Patient Have a Court Appointed Legal Guardian? No data recorded  Name and Contact of Legal Guardian:  No  data recorded If Minor and Not Living with Parent(s), Who has Custody? n/a  Is CPS involved or ever been involved? Never  Is APS involved or ever been involved? Never  Patient Determined To Be At Risk for Harm To Self or Others Based on Review of Patient Reported Information or Presenting Complaint? No   Method: No data recorded  Availability of Means: No data recorded  Intent: No data recorded  Notification Required: No data recorded  Additional Information for Danger to Others Potential:  No data recorded  Additional Comments for Danger to Others Potential:  No data recorded  Are There Guns or Other Weapons in Your Home?  No data recorded   Types of Guns/Weapons: No data recorded   Are These Weapons Safely Secured?                              No data recorded   Who Could Verify You Are Able To Have These Secured:    No data recorded Do You Have any Outstanding Charges, Pending Court Dates, Parole/Probation? No data recorded Contacted To Inform of Risk of Harm To Self or Others: No data recorded Location of Assessment: [facility] ED  Does Patient Present under Involuntary Commitment? No   IVC Papers Initial File Date: No data recorded  [place] of Residence: [place]  Patient Currently Receiving the Following Services: Not Receiving Services   Determination of Need: Emergent (2 hours)   Options For Referral: Outpatient Therapy; Medication Management   Opal Sidles, LCSWA

## 2020-04-11 NOTE — ED Triage Notes (Addendum)
Pt comes with c/o feeling depressed and anxiety. Pt states he got into argument with his mom today. Pt states his mom told him to come here and get help. Pt states he just feels angry.  Pt states no drug or alcohol use. Pt denies any SI or HI.  Pt tearful in triage.  Pt states he just needs some help. Pt states hard time sleeping and not really eating.

## 2020-04-11 NOTE — Consult Note (Signed)
[facility] Consult   Reason for Consult: Consult for 25 year old man who came voluntarily to the ER seeking help with depression and anxiety Referring Physician: Fuller PlanFunke Patient Identification: Matthew Mullins MRN:  [id number] Principal Diagnosis: Moderate recurrent major depression (HCC) Diagnosis:  Principal Problem:   Moderate recurrent major depression (HCC) Active Problems:   Social anxiety disorder   Panic attacks   Total Time spent with patient: 1 hour  Subjective:   Matthew Mullins is a 25 y.o. male patient admitted with "I just feel so bad".  HPI: Patient seen chart reviewed.  This 25 year old man came to the emergency room because of worsening symptoms of depression and anxiety.  Patient says his mood feels sad and down most of the time.  Certainly been bad pretty consistently for a few months.  Often sleeps poorly at night.  Not eating well.  Also has frequent anxiety possibly almost constant anxiety but in particular he mentions having anxiety in any kind of social situation.  Also endorses panic attacks.  Patient denies any suicidal thought intent or plan.  Denies any psychotic symptoms.  He says he has occasionally used marijuana but estimates its usage at about once every 2 weeks.  Not using any other drugs or alcohol.  No homicidal ideation.  Not currently getting any treatment.  He had a hospitalization about a year ago by his report but did not follow-up with outpatient treatment.  Past Psychiatric History: Past history of treatment for depression in the hospital.  Did not follow-up with outpatient treatment after discharge.  Patient could not remember the name of medication.  If it is the Wellbutrin listed in his medicine reconciliation is probably just as well that he did not follow-up since so much of his problem is anxiety.  No history of suicide attempts no history of violence.  Risk to Self:   Risk to Others:   Prior Inpatient Therapy:   Prior  Outpatient Therapy:    Past Medical History:  Past Medical History:  Diagnosis Date  . Acute appendicitis 12/01/2017  . Nephrolithiasis 11/2016   ER visit referred to urology    Past Surgical History:  Procedure Laterality Date  . LAPAROSCOPIC APPENDECTOMY N/A 12/01/2017   Procedure: APPENDECTOMY LAPAROSCOPIC;  Surgeon: Franky MachoJenkins, Mark, MD;  Location: [facility];  Service: General;  Laterality: N/A;  . MYRINGOTOMY WITH TUBE PLACEMENT Left 02/19/2019   Procedure: MYRINGOTOMY WITH TUBE PLACEMENT;  Surgeon: Bud FaceVaught, Creighton, MD;  [facility];  Service: ENT;  Laterality: Left;   Family History:  Family History  Problem Relation Age of Onset  . Arrhythmia Paternal Grandmother        prolonged QT  . Hypertension Paternal Grandmother   . Diabetes Paternal Grandmother   . COPD Paternal Grandfather   . CAD Other        great grandmother  . Stroke Other        great grandmother  . Cancer Other        paternal aunt with leukemia and breast  . Colon cancer Neg Hx    Family Psychiatric  History: None known or reported Social History:  Social History   Substance and Sexual Activity  Alcohol Use No     Social History   Substance and Sexual Activity  Drug Use No    Social History   Socioeconomic History  . Marital status: Single    Spouse name: Not on file  . Number of children: Not on file  .  Years of education: Not on file  . Highest education level: Not on file  Occupational History  . Not on file  Tobacco Use  . Smoking status: Current Some Day Smoker    Years: 1.00  . Smokeless tobacco: Never Used  . Tobacco comment: 1-2 cigs/wk  Vaping Use  . Vaping Use: Never used  Substance and Sexual Activity  . Alcohol use: No  . Drug use: No  . Sexual activity: Not on file  Other Topics Concern  . Not on file  Social History Narrative   Lives with father, step mother, 3 sisters, 2 dogs   Edu: [employer] 11th grade   Activity: JiuJitsu   Social  Determinants of Health   Financial Resource Strain: Not on file  Food Insecurity: Not on file  Transportation Needs: Not on file  Physical Activity: Not on file  Stress: Not on file  Social Connections: Not on file   Additional Social History:    Allergies:  No Known Allergies  Labs:  Results for orders placed or performed during the hospital encounter of 04/11/20 (from the past 48 hour(s))  CBC with Differential     Status: None   Collection Time: 04/11/20  4:01 PM  Result Value Ref Range   WBC 8.0 4.0 - 10.5 K/uL   RBC 5.36 4.22 - 5.81 MIL/uL   Hemoglobin 17.0 13.0 - 17.0 g/dL   HCT 58.8 50.2 - 77.4 %   MCV 90.5 80.0 - 100.0 fL   MCH 31.7 26.0 - 34.0 pg   MCHC 35.1 30.0 - 36.0 g/dL   RDW 12.8 78.6 - 76.7 %   Platelets 255 150 - 400 K/uL   nRBC 0.0 0.0 - 0.2 %   Neutrophils Relative % 76 %   Neutro Abs 6.1 1.7 - 7.7 K/uL   Lymphocytes Relative 16 %   Lymphs Abs 1.3 0.7 - 4.0 K/uL   Monocytes Relative 7 %   Monocytes Absolute 0.6 0.1 - 1.0 K/uL   Eosinophils Relative 0 %   Eosinophils Absolute 0.0 0.0 - 0.5 K/uL   Basophils Relative 1 %   Basophils Absolute 0.0 0.0 - 0.1 K/uL   Immature Granulocytes 0 %   Abs Immature Granulocytes 0.02 0.00 - 0.07 K/uL    Comment: Performed at [facility], [address] [place], [place] [address]  Comprehensive metabolic panel     Status: Abnormal   Collection Time: 04/11/20  4:01 PM  Result Value Ref Range   Sodium 137 135 - 145 mmol/L   Potassium 3.4 (L) 3.5 - 5.1 mmol/L   Chloride 103 98 - 111 mmol/L   CO2 23 22 - 32 mmol/L   Glucose, Bld 87 70 - 99 mg/dL    Comment: Glucose reference range applies only to samples taken after fasting for at least 8 hours.   BUN 16 6 - 20 mg/dL   Creatinine, Ser 0.96 0.61 - 1.24 mg/dL   Calcium 9.4 8.9 - 28.3 mg/dL   Total Protein 8.3 (H) 6.5 - 8.1 g/dL   Albumin 5.2 (H) 3.5 - 5.0 g/dL   AST 33 15 - 41 U/L   ALT 24 0 - 44 U/L   Alkaline Phosphatase 67 38 - 126 U/L   Total  Bilirubin 1.7 (H) 0.3 - 1.2 mg/dL   GFR, Estimated >66 >29 mL/min    Comment: (NOTE) Calculated using the CKD-EPI Creatinine Equation (2021)    Anion gap 11 5 - 15  Comment: Performed at [facility], [address] [place], [place] [address]  Salicylate level     Status: Abnormal   Collection Time: 04/11/20  4:01 PM  Result Value Ref Range   Salicylate Lvl <7.0 (L) 7.0 - 30.0 mg/dL    Comment: Performed at [facility], [address] [place], [place] [address]  Acetaminophen level     Status: Abnormal   Collection Time: 04/11/20  4:01 PM  Result Value Ref Range   Acetaminophen (Tylenol), Serum <10 (L) 10 - 30 ug/mL    Comment: (NOTE) Therapeutic concentrations vary significantly. A range of 10-30 ug/mL  may be an effective concentration for many patients. However, some  are best treated at concentrations outside of this range. Acetaminophen concentrations >150 ug/mL at 4 hours after ingestion  and >50 ug/mL at 12 hours after ingestion are often associated with  toxic reactions.  Performed at [facility], [address] [place], [place] [address]   Ethanol     Status: None   Collection Time: 04/11/20  4:01 PM  Result Value Ref Range   Alcohol, Ethyl (B) <10 <10 mg/dL    Comment: (NOTE) Lowest detectable limit for serum alcohol is 10 mg/dL.  For medical purposes only. Performed at [facility], [address] [place], [place] [address]     No current facility-administered medications for this encounter.   Current Outpatient Medications  Medication Sig Dispense Refill  . FLUoxetine (PROZAC) 20 MG capsule Take 1 capsule (20 mg total) by mouth daily. 30 capsule 2  . hydrOXYzine (ATARAX/VISTARIL) 25 MG tablet Take 25 mg by mouth at bedtime as needed.      Musculoskeletal: Strength & Muscle Tone: within normal limits Gait & Station: normal Patient leans: N/A            Psychiatric Specialty Exam:  Presentation   General Appearance: No data recorded Eye Contact:No data recorded Speech:No data recorded Speech Volume:No data recorded Handedness:No data recorded  Mood and Affect  Mood:No data recorded Affect:No data recorded  Thought Process  Thought Processes:No data recorded Descriptions of Associations:No data recorded Orientation:No data recorded Thought Content:No data recorded History of Schizophrenia/Schizoaffective disorder:No data recorded Duration of Psychotic Symptoms:No data recorded Hallucinations:No data recorded Ideas of Reference:No data recorded Suicidal Thoughts:No data recorded Homicidal Thoughts:No data recorded  Sensorium  Memory:No data recorded Judgment:No data recorded Insight:No data recorded  Executive Functions  Concentration:No data recorded Attention Span:No data recorded Recall:No data recorded Fund of Knowledge:No data recorded Language:No data recorded  Psychomotor Activity  Psychomotor Activity:No data recorded  Assets  Assets:No data recorded  Sleep  Sleep:No data recorded  Physical Exam: Physical Exam Vitals and nursing note reviewed.  Constitutional:      Appearance: Normal appearance.  HENT:     Head: Normocephalic and atraumatic.     Mouth/Throat:     Pharynx: Oropharynx is clear.  Eyes:     Pupils: Pupils are equal, round, and reactive to light.  Cardiovascular:     Rate and Rhythm: Normal rate and regular rhythm.  Pulmonary:     Effort: Pulmonary effort is normal.     Breath sounds: Normal breath sounds.  Abdominal:     General: Abdomen is flat.     Palpations: Abdomen is soft.  Musculoskeletal:        General: Normal range of motion.  Skin:    General: Skin is warm and dry.  Neurological:     General: No focal deficit present.  Mental Status: He is alert. Mental status is at baseline.  Psychiatric:        Attention and Perception: Attention normal.        Mood and Affect: Mood is anxious and depressed. Affect is  tearful.        Speech: Speech is delayed.        Behavior: Behavior is slowed.        Thought Content: Thought content normal. Thought content is not paranoid or delusional. Thought content does not include homicidal or suicidal ideation.        Cognition and Memory: Cognition normal.        Judgment: Judgment normal.    Review of Systems  Constitutional: Negative.   HENT: Negative.   Eyes: Negative.   Respiratory: Negative.   Cardiovascular: Negative.   Gastrointestinal: Negative.   Musculoskeletal: Negative.   Skin: Negative.   Neurological: Negative.   Psychiatric/Behavioral: Positive for depression. Negative for hallucinations, substance abuse and suicidal ideas. The patient is nervous/anxious and has insomnia.    Blood pressure (!) 162/113, pulse 88, temperature 98.7 F (37.1 C), resp. rate 17, height 5\' 5"  (1.651 m), weight 54.4 kg, SpO2 100 %. Body mass index is 19.97 kg/m.  Treatment Plan Summary: Medication management and Plan Healthy-appearing but depressed appearing young man.  Cooperative with the interview.  Sadly tearful throughout most of the visit.  Able to give a pretty clear history of depression and severe anxiety which has been going on since childhood.  Not currently suicidal not psychotic and does not need hospitalization.  Patient was given psychoeducation about depression and anxiety disorders.  Strongly encouraged to follow-up with treatment as I tried to make it clear to him that these things could become chronic lifelong issues if they were not treated.  I suggest starting fluoxetine 20 mg a day.  Side effects reviewed.  Prescription written and given to him with refills on it.  We are giving him resources and recommendations for outpatient mental health follow-up for therapy and med management in his home community.  Patient expresses understanding of the plan.  Case reviewed with emergency room doctor.  Disposition: No evidence of imminent risk to self or  others at present.   Patient does not meet criteria for psychiatric inpatient admission. Supportive therapy provided about ongoing stressors. Discussed crisis plan, support from social network, calling 911, coming to the Emergency Department, and calling Suicide Hotline.  , MD 04/11/2020 4:58 PM

## 2020-04-11 NOTE — Discharge Instructions (Addendum)
Your total bilirubin was elevated this can be followed up with your primary care doctor.  You were seen by psychiatry team and started on new medication they gave you resources.  Return to the ER if you develop thoughts of hurting herself or any other concerns

## 2020-04-11 NOTE — ED Provider Notes (Addendum)
[facility] Emergency Department Provider Note  ____________________________________________   Event Date/Time   First MD Initiated Contact with Patient 04/11/20 1553     (approximate)  I have reviewed the triage vital signs and the nursing notes.   HISTORY  Chief Complaint Anxiety    HPI Matthew Mullins is a 25 y.o. male who is otherwise healthy who comes in for feeling depressed and anxious.  Patient reportedly got into an argument with his mom today.  His mom told him to come here to get help.  He denies any alcohol or drug use and denies any SI or HI.  Patient states that he used to be on medications for depression but is not on them currently.  He states that is constant, nothing makes it better, nothing makes it worse.  Denies any hallucinations.  Occasional marijuana use but denies anything else.  Denies any other symptoms.  States that maybe he is not eating as much but it is just because he just does not feel hungry.  Denies any abdominal pain.          Past Medical History:  Diagnosis Date  . Acute appendicitis 12/01/2017  . Nephrolithiasis 11/2016   ER visit referred to urology    Patient Active Problem List   Diagnosis Date Noted  . Right sided abdominal pain 04/28/2018  . Hypokalemia 12/01/2017  . Elevated bilirubin 12/01/2017  . Constipation 09/12/2016  . Well adolescent visit 06/24/2012    Past Surgical History:  Procedure Laterality Date  . LAPAROSCOPIC APPENDECTOMY N/A 12/01/2017   Procedure: APPENDECTOMY LAPAROSCOPIC;  Surgeon: Franky Macho, MD;  Location: [facility];  Service: General;  Laterality: N/A;  . MYRINGOTOMY WITH TUBE PLACEMENT Left 02/19/2019   Procedure: MYRINGOTOMY WITH TUBE PLACEMENT;  Surgeon: Bud Face, MD;  Location: [facility];  Service: ENT;  Laterality: Left;    Prior to Admission medications   Medication Sig Start Date End Date Taking? Authorizing Provider  buPROPion (WELLBUTRIN XL) 300  MG 24 hr tablet Take 300 mg by mouth daily.    [provider]  hydrOXYzine (ATARAX/VISTARIL) 25 MG tablet Take 25 mg by mouth at bedtime as needed.    [provider]    Allergies Patient has no known allergies.  Family History  Problem Relation Age of Onset  . Arrhythmia Paternal Grandmother        prolonged QT  . Hypertension Paternal Grandmother   . Diabetes Paternal Grandmother   . COPD Paternal Grandfather   . CAD Other        great grandmother  . Stroke Other        great grandmother  . Cancer Other        paternal aunt with leukemia and breast  . Colon cancer Neg Hx     Social History Social History   Tobacco Use  . Smoking status: Current Some Day Smoker    Years: 1.00  . Smokeless tobacco: Never Used  . Tobacco comment: 1-2 cigs/wk  Vaping Use  . Vaping Use: Never used  Substance Use Topics  . Alcohol use: No  . Drug use: No      Review of Systems Constitutional: No fever/chills Eyes: No visual changes. ENT: No sore throat. Cardiovascular: Denies chest pain. Respiratory: Denies shortness of breath. Gastrointestinal: No abdominal pain.  No nausea, no vomiting.  No diarrhea.  No constipation.  Not eating as much Genitourinary: Negative for dysuria. Musculoskeletal: Negative for back pain. Skin: Negative for  rash. Neurological: Negative for headaches, focal weakness or numbness. All other ROS negative ____________________________________________   PHYSICAL EXAM:  VITAL SIGNS: ED Triage Vitals  Enc Vitals Group     BP 04/11/20 1534 (!) 162/113     Pulse Rate 04/11/20 1534 88     Resp 04/11/20 1534 17     Temp 04/11/20 1534 98.7 F (37.1 C)     Temp src --      SpO2 04/11/20 1534 100 %     Weight 04/11/20 1534 120 lb (54.4 kg)     Height 04/11/20 1534 5\' 5"  (1.651 m)     Head Circumference --      Peak Flow --      Pain Score 04/11/20 1530 0     Pain Loc --      Pain Edu? --      Excl. in GC? --     Constitutional:  Alert and oriented. Well appearing and in no acute distress. Eyes: Conjunctivae are normal. EOMI. Head: Atraumatic. Nose: No congestion/rhinnorhea. Mouth/Throat: Mucous membranes are moist.   Neck: No stridor. Trachea Midline. FROM Cardiovascular: Normal rate, regular rhythm. Grossly normal heart sounds.  Good peripheral circulation. Respiratory: Normal respiratory effort.  No retractions. Lungs CTAB. Gastrointestinal: Soft and nontender. No distention. No abdominal bruits.  Musculoskeletal: No lower extremity tenderness nor edema.  No joint effusions. Neurologic:  Normal speech and language. No gross focal neurologic deficits are appreciated.  Skin:  Skin is warm, dry and intact. No rash noted. Psychiatric: Mood and affect are normal. Speech and behavior are normal. GU: Deferred   ____________________________________________   LABS (all labs ordered are listed, but only abnormal results are displayed)  Labs Reviewed  COMPREHENSIVE METABOLIC PANEL - Abnormal; Notable for the following components:      Result Value   Potassium 3.4 (*)    Total Protein 8.3 (*)    Albumin 5.2 (*)    Total Bilirubin 1.7 (*)    All other components within normal limits  SALICYLATE LEVEL - Abnormal; Notable for the following components:   Salicylate Lvl <7.0 (*)    All other components within normal limits  ACETAMINOPHEN LEVEL - Abnormal; Notable for the following components:   Acetaminophen (Tylenol), Serum <10 (*)    All other components within normal limits  CBC WITH DIFFERENTIAL/PLATELET  ETHANOL  URINE DRUG SCREEN, QUALITATIVE ([facility] ONLY)   ____________________________________________   PROCEDURES  Procedure(s) performed (including Critical Care):  Procedures   ____________________________________________   INITIAL IMPRESSION / ASSESSMENT AND PLAN / ED COURSE  Matthew Mullins was evaluated in Emergency Department on 04/11/2020 for the symptoms described in the history of present  illness. He was evaluated in the context of the global COVID-19 pandemic, which necessitated consideration that the patient might be at risk for infection with the SARS-CoV-2 virus that causes COVID-19. Institutional protocols and algorithms that pertain to the evaluation of patients at risk for COVID-19 are in a state of rapid change based on information released by regulatory bodies including the CDC and federal and state organizations. These policies and algorithms were followed during the patient's care in the ED.    Patient is a 25 year old with depression and anxiety who comes in with the symptoms and not eating just secondary not being hungry.  Labs were ordered to evaluate electrolyte abnormality, AKI.  Abdomen is soft and nontender I have low suspicion for acute abdominal process such as appendicitis, cholecystitis.  Will consult psychiatry for further management given concern  for psychiatric illness.  At this time he does not meet IVC criteria.  Patient was seen by psychiatry and cleared for discharge home.  They are going to start him on Prozac and given resources for follow-up with a therapist.  Patient's total bilirubin is slightly elevated at 1.7 but it seems that patient had some elevation in previously 2 years ago was 1.6. Pt has had [place] that was negative for GB issues. No RUQ tenderness today to suggest re-imaging.  Discussed with patient that he should have this followed up with a primary care doctor but at this time is no right upper quadrant tenderness I have low suspicion for biliary pathology. Pt also HTN and encouraged to f/u with PCP for recheck   Reevaluated patient he feels comfortable going home at this time with a new prescription and resources  I discussed the provisional nature of ED diagnosis, the treatment so far, the ongoing plan of care, follow up appointments and return precautions with the patient and any family or support people present. They expressed understanding and  agreed with the plan, discharged home.            ____________________________________________   FINAL CLINICAL IMPRESSION(S) / ED DIAGNOSES   Final diagnoses:  Anxiety      MEDICATIONS GIVEN DURING THIS VISIT:  Medications  potassium chloride SA (KLOR-CON) CR tablet 40 mEq (has no administration in time range)     ED Discharge Orders         Ordered    FLUoxetine (PROZAC) 20 MG capsule  Daily        04/11/20 1634           Note:  This document was prepared using Dragon voice recognition software and may include unintentional dictation errors.   Concha Se, MD 04/11/20 1717    Concha Se, MD 04/11/20 [phone]

## 2020-04-11 NOTE — ED Notes (Signed)
Offered pt food tray at this time. Pt refused.

## 2020-04-11 NOTE — ED Notes (Signed)
TTS and Dr. Toni Amend speaking to pt in the interview room at this time.

## 2020-07-12 DIAGNOSIS — F418 Other specified anxiety disorders: Secondary | ICD-10-CM | POA: Diagnosis not present

## 2020-11-13 ENCOUNTER — Emergency Department (HOSPITAL_COMMUNITY)
Admission: EM | Admit: 2020-11-13 | Discharge: 2020-11-13 | Disposition: A | Discharge status: Home / Self Care | Payer: 59 | Attending: Emergency Medicine | Admitting: Emergency Medicine

## 2020-11-13 ENCOUNTER — Emergency Department (HOSPITAL_COMMUNITY): Payer: 59

## 2020-11-13 ENCOUNTER — Encounter (HOSPITAL_COMMUNITY): Payer: Self-pay

## 2020-11-13 ENCOUNTER — Other Ambulatory Visit: Payer: Self-pay

## 2020-11-13 DIAGNOSIS — R059 Cough, unspecified: Secondary | ICD-10-CM | POA: Diagnosis not present

## 2020-11-13 DIAGNOSIS — J111 Influenza due to unidentified influenza virus with other respiratory manifestations: Secondary | ICD-10-CM

## 2020-11-13 DIAGNOSIS — R0602 Shortness of breath: Secondary | ICD-10-CM | POA: Diagnosis not present

## 2020-11-13 DIAGNOSIS — J101 Influenza due to other identified influenza virus with other respiratory manifestations: Secondary | ICD-10-CM | POA: Diagnosis not present

## 2020-11-13 DIAGNOSIS — J3489 Other specified disorders of nose and nasal sinuses: Secondary | ICD-10-CM | POA: Insufficient documentation

## 2020-11-13 DIAGNOSIS — Z20822 Contact with and (suspected) exposure to covid-19: Secondary | ICD-10-CM | POA: Diagnosis not present

## 2020-11-13 DIAGNOSIS — R6883 Chills (without fever): Secondary | ICD-10-CM | POA: Diagnosis not present

## 2020-11-13 DIAGNOSIS — F17210 Nicotine dependence, cigarettes, uncomplicated: Secondary | ICD-10-CM | POA: Insufficient documentation

## 2020-11-13 LAB — RESP PANEL BY RT-PCR (FLU A&B, COVID) ARPGX2
Influenza A by PCR: POSITIVE — AB
Influenza B by PCR: NEGATIVE
SARS Coronavirus 2 by RT PCR: NEGATIVE

## 2020-11-13 MED ORDER — ALBUTEROL SULFATE HFA 108 (90 BASE) MCG/ACT IN AERS: 2.0000 | INHALATION_SPRAY | RESPIRATORY_TRACT | 3 refills | Status: AC | PRN

## 2020-11-13 NOTE — Discharge Instructions (Signed)
Your testing was unremarkable, your x-ray shows no pneumonia, you likely have the flu.  Then the inhaler for you, take 2 puffs every 4 hours as needed, this may be used with DayQuil or NyQuil and allergy medicine such as Zyrtec once a day and Flonase nasal spray in the morning

## 2020-11-13 NOTE — ED Notes (Signed)
Pt A&OX4 ambulatory at d/c with independent steady gait, NAD. Pt and mother verbalized understanding of d/c instructions and follow up care.

## 2020-11-13 NOTE — ED Triage Notes (Signed)
Patient reports SOB, insomnia, chills, sweating, cough since Monday night.

## 2020-11-13 NOTE — ED Provider Notes (Signed)
[facility] EMERGENCY DEPARTMENT Provider Note   CSN: [id number] Arrival date & time: 11/13/20  1816     History Chief Complaint  Patient presents with   Shortness of Breath    Matthew Mullins is a 25 y.o. male.   Shortness of Breath  Exposed to flu at home, has had 1 week of symptoms including coughing nasal congestion and some difficulty breathing, symptoms are persistent, not seeming to get better very much, not associated with fevers at this time though he did have fevers early on.  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   Social anxiety disorder 04/11/2020   Panic attacks 04/11/2020   Right sided abdominal pain 04/28/2018   Hypokalemia 12/01/2017   Elevated bilirubin 12/01/2017   Constipation 09/12/2016   Well adolescent visit 06/24/2012    Past Surgical History:  Procedure Laterality Date   LAPAROSCOPIC APPENDECTOMY N/A 12/01/2017   Procedure: APPENDECTOMY LAPAROSCOPIC;  Surgeon: Franky Macho, MD;  Location: [facility];  Service: General;  Laterality: N/A;   MYRINGOTOMY WITH TUBE PLACEMENT Left 02/19/2019   Procedure: MYRINGOTOMY WITH TUBE PLACEMENT;  Surgeon: Bud Face, MD;  Location: [facility];  Service: ENT;  Laterality: Left;       Family History  Problem Relation Age of Onset   Arrhythmia Paternal Grandmother        prolonged QT   Hypertension Paternal Grandmother    Diabetes Paternal Grandmother    COPD Paternal Grandfather    CAD Other        great grandmother   Stroke Other        great grandmother   Cancer Other        paternal aunt with leukemia and breast   Colon cancer Neg Hx     Social History   Tobacco Use   Smoking status: Some Days    Years: 1.00     Types: Cigarettes   Smokeless tobacco: Never   Tobacco comments:    1-2 cigs/wk  Vaping Use   Vaping Use: Never used  Substance Use Topics   Alcohol use: No   Drug use: Yes    Types: Marijuana    Home Medications Prior to Admission medications   Medication Sig Start Date End Date Taking? Authorizing Provider  albuterol (VENTOLIN HFA) 108 (90 Base) MCG/ACT inhaler Inhale 2 puffs into the lungs every 4 (four) hours as needed for wheezing or shortness of breath. 11/13/20  Yes Eber Hong, MD  FLUoxetine (PROZAC) 20 MG capsule Take 1 capsule (20 mg total) by mouth daily. 04/11/20 07/10/20  Clapacs, Jackquline Denmark, MD  hydrOXYzine (ATARAX/VISTARIL) 25 MG tablet Take 25 mg by mouth at bedtime as needed.    [provider]    Allergies    Patient has no known allergies.  Review of Systems   Review of Systems  Respiratory:  Positive for shortness of breath.   All other systems reviewed and are negative.  Physical Exam Updated Vital Signs BP 119/89 (BP Location: Right Arm)   Pulse 75   Temp 98.8 F (37.1 C) (Oral)   Resp 17   Ht 1.651 m (5\' 5" )   Wt 54.4 kg   SpO2 100%   BMI 19.97 kg/m  Physical Exam Vitals and nursing note reviewed.  Constitutional:      General: He is not in acute distress.    Appearance: He is well-developed.  HENT:     Head: Normocephalic and atraumatic.     Nose: Congestion and rhinorrhea present.     Mouth/Throat:     Mouth: Mucous membranes are moist.     Pharynx: No oropharyngeal exudate or posterior oropharyngeal erythema.  Eyes:     General: No scleral icterus.       Right eye: No discharge.        Left eye: No discharge.     Conjunctiva/sclera: Conjunctivae normal.     Pupils: Pupils are equal, round, and reactive to light.  Neck:     Thyroid: No thyromegaly.     Vascular: No JVD.  Cardiovascular:     Rate and Rhythm: Normal rate and regular rhythm.     Heart sounds: Normal heart sounds. No murmur heard.   No friction rub. No gallop.   Pulmonary:     Effort: Pulmonary effort is normal. No respiratory distress.     Breath sounds: Normal breath sounds. No wheezing or rales.  Abdominal:     General: Bowel sounds are normal. There is no distension.     Palpations: Abdomen is soft. There is no mass.     Tenderness: There is no abdominal tenderness.  Musculoskeletal:        General: No tenderness. Normal range of motion.     Cervical back: Normal range of motion and neck supple.  Lymphadenopathy:     Cervical: No cervical adenopathy.  Skin:    General: Skin is warm and dry.     Findings: No erythema or rash.  Neurological:     Mental Status: He is alert.     Coordination: Coordination normal.  Psychiatric:        Behavior: Behavior normal.    ED Results / Procedures / Treatments   Labs (all labs ordered are listed, but only abnormal results are displayed) Labs Reviewed  RESP PANEL BY RT-PCR (FLU A&B, COVID) [id number]    EKG None  Radiology DG Chest 2 View  Result Date: 11/13/2020 CLINICAL DATA:  Shortness of breath, insomnia, chills, sweating and cough EXAM: CHEST - 2 VIEW COMPARISON:  None. FINDINGS: The heart size and mediastinal contours are within normal limits. No focal consolidation. No pleural effusion. No pneumothorax. The visualized skeletal structures are unremarkable. IMPRESSION: No active cardiopulmonary disease. Electronically Signed   By: Maudry Mayhew M.D.   On: 11/13/2020 19:39    Procedures Procedures   Medications Ordered in ED Medications - No data to display  ED Course  I have reviewed the triage vital signs and the nursing notes.  Pertinent labs & imaging results that were available during my care of the patient were reviewed by me and considered in my medical decision making (see chart for details).    MDM Rules/Calculators/A&P                           Exposed the patient family member with full, he has no acute findings on exam and normal vital signs, stable for discharge with  albuterol inhaler  Final Clinical Impression(s) / ED Diagnoses Final diagnoses:  Influenza    Rx / DC Orders ED Discharge Orders          Ordered    albuterol (VENTOLIN HFA) 108 (90 Base) MCG/ACT inhaler  Every 4 hours PRN        11/13/20 Maximino Greenland, MD 11/13/20 2006

## 2020-12-18 DIAGNOSIS — F418 Other specified anxiety disorders: Secondary | ICD-10-CM | POA: Diagnosis not present

## 2021-04-16 DIAGNOSIS — F418 Other specified anxiety disorders: Secondary | ICD-10-CM | POA: Diagnosis not present

## 2021-07-18 DIAGNOSIS — M26609 Unspecified temporomandibular joint disorder, unspecified side: Secondary | ICD-10-CM | POA: Diagnosis not present

## 2021-08-21 DIAGNOSIS — Z681 Body mass index (BMI) 19 or less, adult: Secondary | ICD-10-CM | POA: Diagnosis not present

## 2021-08-21 DIAGNOSIS — F418 Other specified anxiety disorders: Secondary | ICD-10-CM | POA: Diagnosis not present

## 2021-08-22 DIAGNOSIS — F4310 Post-traumatic stress disorder, unspecified: Secondary | ICD-10-CM | POA: Diagnosis not present

## 2021-08-22 DIAGNOSIS — F329 Major depressive disorder, single episode, unspecified: Secondary | ICD-10-CM | POA: Diagnosis not present

## 2021-08-27 DIAGNOSIS — F418 Other specified anxiety disorders: Secondary | ICD-10-CM | POA: Diagnosis not present

## 2021-08-31 DIAGNOSIS — F329 Major depressive disorder, single episode, unspecified: Secondary | ICD-10-CM | POA: Diagnosis not present

## 2021-08-31 DIAGNOSIS — F4310 Post-traumatic stress disorder, unspecified: Secondary | ICD-10-CM | POA: Diagnosis not present

## 2021-09-14 DIAGNOSIS — F329 Major depressive disorder, single episode, unspecified: Secondary | ICD-10-CM | POA: Diagnosis not present

## 2021-09-28 DIAGNOSIS — F4310 Post-traumatic stress disorder, unspecified: Secondary | ICD-10-CM | POA: Diagnosis not present

## 2021-09-28 DIAGNOSIS — F329 Major depressive disorder, single episode, unspecified: Secondary | ICD-10-CM | POA: Diagnosis not present

## 2021-10-01 DIAGNOSIS — F418 Other specified anxiety disorders: Secondary | ICD-10-CM | POA: Diagnosis not present

## 2021-10-26 DIAGNOSIS — F4310 Post-traumatic stress disorder, unspecified: Secondary | ICD-10-CM | POA: Diagnosis not present

## 2021-10-26 DIAGNOSIS — F329 Major depressive disorder, single episode, unspecified: Secondary | ICD-10-CM | POA: Diagnosis not present

## 2021-11-09 DIAGNOSIS — F418 Other specified anxiety disorders: Secondary | ICD-10-CM | POA: Diagnosis not present

## 2021-11-09 DIAGNOSIS — F4310 Post-traumatic stress disorder, unspecified: Secondary | ICD-10-CM | POA: Diagnosis not present

## 2021-11-09 DIAGNOSIS — F329 Major depressive disorder, single episode, unspecified: Secondary | ICD-10-CM | POA: Diagnosis not present

## 2021-11-14 DIAGNOSIS — F4310 Post-traumatic stress disorder, unspecified: Secondary | ICD-10-CM | POA: Diagnosis not present

## 2021-11-14 DIAGNOSIS — F329 Major depressive disorder, single episode, unspecified: Secondary | ICD-10-CM | POA: Diagnosis not present

## 2021-12-25 DIAGNOSIS — F329 Major depressive disorder, single episode, unspecified: Secondary | ICD-10-CM | POA: Diagnosis not present

## 2021-12-25 DIAGNOSIS — F4310 Post-traumatic stress disorder, unspecified: Secondary | ICD-10-CM | POA: Diagnosis not present

## 2022-01-22 DIAGNOSIS — R739 Hyperglycemia, unspecified: Secondary | ICD-10-CM | POA: Diagnosis not present

## 2022-01-22 DIAGNOSIS — R519 Headache, unspecified: Secondary | ICD-10-CM | POA: Diagnosis not present

## 2022-01-30 DIAGNOSIS — F4310 Post-traumatic stress disorder, unspecified: Secondary | ICD-10-CM | POA: Diagnosis not present

## 2022-01-30 DIAGNOSIS — F329 Major depressive disorder, single episode, unspecified: Secondary | ICD-10-CM | POA: Diagnosis not present

## 2022-02-04 DIAGNOSIS — M26609 Unspecified temporomandibular joint disorder, unspecified side: Secondary | ICD-10-CM | POA: Diagnosis not present

## 2022-03-11 DIAGNOSIS — F418 Other specified anxiety disorders: Secondary | ICD-10-CM | POA: Diagnosis not present

## 2022-04-29 DIAGNOSIS — F329 Major depressive disorder, single episode, unspecified: Secondary | ICD-10-CM | POA: Diagnosis not present

## 2022-04-29 DIAGNOSIS — F4310 Post-traumatic stress disorder, unspecified: Secondary | ICD-10-CM | POA: Diagnosis not present

## 2022-04-29 DIAGNOSIS — F418 Other specified anxiety disorders: Secondary | ICD-10-CM | POA: Diagnosis not present

## 2022-05-06 DIAGNOSIS — F329 Major depressive disorder, single episode, unspecified: Secondary | ICD-10-CM | POA: Diagnosis not present

## 2022-05-06 DIAGNOSIS — F418 Other specified anxiety disorders: Secondary | ICD-10-CM | POA: Diagnosis not present

## 2022-05-06 DIAGNOSIS — F4310 Post-traumatic stress disorder, unspecified: Secondary | ICD-10-CM | POA: Diagnosis not present

## 2022-05-08 DIAGNOSIS — Z681 Body mass index (BMI) 19 or less, adult: Secondary | ICD-10-CM | POA: Diagnosis not present

## 2022-05-08 DIAGNOSIS — F418 Other specified anxiety disorders: Secondary | ICD-10-CM | POA: Diagnosis not present

## 2022-05-20 DIAGNOSIS — F418 Other specified anxiety disorders: Secondary | ICD-10-CM | POA: Diagnosis not present

## 2022-06-05 DIAGNOSIS — F4310 Post-traumatic stress disorder, unspecified: Secondary | ICD-10-CM | POA: Diagnosis not present

## 2022-06-05 DIAGNOSIS — F418 Other specified anxiety disorders: Secondary | ICD-10-CM | POA: Diagnosis not present

## 2022-06-05 DIAGNOSIS — F329 Major depressive disorder, single episode, unspecified: Secondary | ICD-10-CM | POA: Diagnosis not present

## 2022-06-10 DIAGNOSIS — F418 Other specified anxiety disorders: Secondary | ICD-10-CM | POA: Diagnosis not present

## 2022-07-15 DIAGNOSIS — F418 Other specified anxiety disorders: Secondary | ICD-10-CM | POA: Diagnosis not present

## 2022-09-10 DIAGNOSIS — F411 Generalized anxiety disorder: Secondary | ICD-10-CM | POA: Diagnosis not present

## 2022-09-10 DIAGNOSIS — F329 Major depressive disorder, single episode, unspecified: Secondary | ICD-10-CM | POA: Diagnosis not present

## 2022-12-09 DIAGNOSIS — F329 Major depressive disorder, single episode, unspecified: Secondary | ICD-10-CM | POA: Diagnosis not present

## 2023-02-05 ENCOUNTER — Other Ambulatory Visit: Payer: Self-pay

## 2023-02-05 ENCOUNTER — Encounter: Payer: Self-pay | Admitting: *Deleted

## 2023-02-05 ENCOUNTER — Emergency Department
Admission: EM | Admit: 2023-02-05 | Discharge: 2023-02-05 | Disposition: A | Discharge status: Home / Self Care | Payer: Self-pay | Attending: Emergency Medicine | Admitting: Emergency Medicine

## 2023-02-05 DIAGNOSIS — K5900 Constipation, unspecified: Secondary | ICD-10-CM | POA: Insufficient documentation

## 2023-02-05 DIAGNOSIS — Y900 Blood alcohol level of less than 20 mg/100 ml: Secondary | ICD-10-CM | POA: Insufficient documentation

## 2023-02-05 DIAGNOSIS — R0789 Other chest pain: Secondary | ICD-10-CM | POA: Insufficient documentation

## 2023-02-05 DIAGNOSIS — F1210 Cannabis abuse, uncomplicated: Secondary | ICD-10-CM | POA: Insufficient documentation

## 2023-02-05 DIAGNOSIS — F419 Anxiety disorder, unspecified: Secondary | ICD-10-CM | POA: Insufficient documentation

## 2023-02-05 HISTORY — DX: Anxiety disorder, unspecified: F41.9

## 2023-02-05 LAB — COMPREHENSIVE METABOLIC PANEL
ALT: 13 U/L (ref 0–44)
AST: 28 U/L (ref 15–41)
Albumin: 5 g/dL (ref 3.5–5.0)
Alkaline Phosphatase: 76 U/L (ref 38–126)
Anion gap: 12 (ref 5–15)
BUN: 13 mg/dL (ref 6–20)
CO2: 24 mmol/L (ref 22–32)
Calcium: 9.5 mg/dL (ref 8.9–10.3)
Chloride: 102 mmol/L (ref 98–111)
Creatinine, Ser: 0.92 mg/dL (ref 0.61–1.24)
GFR, Estimated: >60 mL/min (ref >60)
Glucose, Bld: 92 mg/dL (ref 70–99)
Potassium: 3.8 mmol/L (ref 3.5–5.1)
Sodium: 138 mmol/L (ref 135–145)
Total Bilirubin: 1.2 mg/dL (ref 0.0–1.2)
Total Protein: 8.5 g/dL — ABNORMAL HIGH (ref 6.5–8.1)

## 2023-02-05 LAB — CBC
HCT: 45.3 % (ref 39.0–52.0)
Hemoglobin: 15.9 g/dL (ref 13.0–17.0)
MCH: 31.2 pg (ref 26.0–34.0)
MCHC: 35.1 g/dL (ref 30.0–36.0)
MCV: 88.8 fL (ref 80.0–100.0)
Platelets: 307 10*3/uL (ref 150–400)
RBC: 5.1 MIL/uL (ref 4.22–5.81)
RDW: 12.5 % (ref 11.5–15.5)
WBC: 7 10*3/uL (ref 4.0–10.5)
nRBC: 0 % (ref 0.0–0.2)

## 2023-02-05 LAB — URINE DRUG SCREEN, QUALITATIVE (ARMC ONLY)
Amphetamines, Ur Screen: NOT DETECTED
Barbiturates, Ur Screen: NOT DETECTED
Benzodiazepine, Ur Scrn: NOT DETECTED
Cannabinoid 50 Ng, Ur ~~LOC~~: POSITIVE — AB
Cocaine Metabolite,Ur ~~LOC~~: NOT DETECTED
MDMA (Ecstasy)Ur Screen: NOT DETECTED
Methadone Scn, Ur: NOT DETECTED
Opiate, Ur Screen: NOT DETECTED
Phencyclidine (PCP) Ur S: NOT DETECTED
Tricyclic, Ur Screen: NOT DETECTED

## 2023-02-05 LAB — ETHANOL: Alcohol, Ethyl (B): <10 mg/dL (ref <10)

## 2023-02-05 LAB — TROPONIN I (HIGH SENSITIVITY): Troponin I (High Sensitivity): <2 ng/L (ref <18)

## 2023-02-05 LAB — SALICYLATE LEVEL: Salicylate Lvl: <7 mg/dL — ABNORMAL LOW (ref 7.0–30.0)

## 2023-02-05 LAB — ACETAMINOPHEN LEVEL: Acetaminophen (Tylenol), Serum: <10 ug/mL — ABNORMAL LOW (ref 10–30)

## 2023-02-05 NOTE — Discharge Instructions (Addendum)
Your exam and labs are normal and reassuring. Take your prescription meds as directed. Follow-up with your provider as scheduled.

## 2023-02-05 NOTE — ED Triage Notes (Addendum)
Pt reports that he has been dx with anxiety and started on zoloft.  Pt states that he is continues to feel anxious and has been feeling heavy on his chest when he wakes up in am, pt states that this worries him.  He also reports that he is having "a hard time using the bathroom" LBM was last pm but it was hard. He also reports that his stomach has been "sour" all week.

## 2023-02-05 NOTE — ED Provider Notes (Cosign Needed)
[facility] Emergency Department Provider Note     Event Date/Time   First MD Initiated Contact with Patient 02/05/23 1640     (approximate)   History   Anxiety   HPI  Matthew Mullins is a 28 y.o. male with a history of anxiety, depression, presents to the ED for evaluation of ongoing anxiety as well as some chest heaviness.  Patient reports he was started on Zoloft about a month ago, spicular medication as prescribed.  He continues to endorse feelings of stress and anxiety related to leaving the house.  He would endorse and waking with some heaviness in the chest and being very concerned about that.  He would also endorse some recent slow bowel emptying and constipation.  Patient denies any fevers, chills, sweats.  No reports of any cough, congestion, or shortness of breath.  Physical Exam   Triage Vital Signs: ED Triage Vitals  Encounter Vitals Group     BP 02/05/23 1259 (!) 164/97     Systolic BP Percentile --      Diastolic BP Percentile --      Pulse Rate 02/05/23 1259 72     Resp 02/05/23 1259 16     Temp 02/05/23 1259 98.8 F (37.1 C)     Temp Source 02/05/23 1259 Oral     SpO2 02/05/23 1259 99 %     Weight --      Height --      Head Circumference --      Peak Flow --      Pain Score 02/05/23 1300 0     Pain Loc --      Pain Education --      Exclude from Growth Chart --     Most recent vital signs: Vitals:   02/05/23 1259  BP: (!) 164/97  Pulse: 72  Resp: 16  Temp: 98.8 F (37.1 C)  SpO2: 99%    General Awake, no distress. NAD HEENT NCAT. PERRL. EOMI. No rhinorrhea. Mucous membranes are moist.  CV:  Good peripheral perfusion. RRR RESP:  Normal effort. CTA ABD:  No distention.  Soft and nontender.  Normoactive bowel sounds noted. MSK:  Normal active range of motion of all extremities. NEURO: Cranial nerves II to XII grossly intact. PSYCH: Appropriate behavior. Normal mood and affect   ED Results / Procedures /  Treatments   Labs (all labs ordered are listed, but only abnormal results are displayed) Labs Reviewed  COMPREHENSIVE METABOLIC PANEL - Abnormal; Notable for the following components:      Result Value   Total Protein 8.5 (*)    All other components within normal limits  SALICYLATE LEVEL - Abnormal; Notable for the following components:   Salicylate Lvl <7.0 (*)    All other components within normal limits  ACETAMINOPHEN LEVEL - Abnormal; Notable for the following components:   Acetaminophen (Tylenol), Serum <10 (*)    All other components within normal limits  URINE DRUG SCREEN, QUALITATIVE ([facility] ONLY) - Abnormal; Notable for the following components:   Cannabinoid 50 Ng, Ur [place] POSITIVE (*)    All other components within normal limits  ETHANOL  CBC  TROPONIN I (HIGH SENSITIVITY)   EKG   RADIOLOGY No results found.   PROCEDURES:  Critical Care performed: No  Procedures   MEDICATIONS ORDERED IN ED: Medications - No data to display   IMPRESSION / MDM / ASSESSMENT AND PLAN / ED COURSE  I reviewed the triage vital  signs and the nursing notes.                              Differential diagnosis includes, but is not limited to, anxiety, depression, psychosis, mood disorder, drug-induced mood disorder  Patient's presentation is most consistent with acute complicated illness / injury requiring diagnostic workup.  Patient's diagnosis is consistent with generalized anxiety and constipation.  Patient with otherwise reassuring exam at this time.  Patient does not endorse any SI/HI at this presentation.  Patient is taking his medications as prescribed.  His grandmother is present as his support, and is a reliable contact for the patient.  Patient will increase water as well as fiber intake in the interim.  Patient is to follow up with his PCP or counselor as discussed, as needed or otherwise directed. Patient is given ED precautions to return to the ED for any worsening or new  symptoms.   FINAL CLINICAL IMPRESSION(S) / ED DIAGNOSES   Final diagnoses:  Anxiety     Rx / DC Orders   ED Discharge Orders     None        Note:  This document was prepared using Dragon voice recognition software and may include unintentional dictation errors.    Lissa Hoard, PA-C 02/08/23 1021

## 2023-02-05 NOTE — ED Provider Triage Note (Signed)
Emergency Medicine Provider Triage Evaluation Note  TRAMMELL BOWDEN , a 28 y.o. male  was evaluated in triage.  Pt complains of anxiety, recently changed from Prozac to Zoloft.  No SI/HI.  Review of Systems  Positive:  Negative:   Physical Exam  BP (!) 164/97   Pulse 72   Temp 98.8 F (37.1 C) (Oral)   Resp 16   SpO2 99%  Gen:   Awake, no distress   Resp:  Normal effort  MSK:   Moves extremities without difficulty  Other:    Medical Decision Making  Medically screening exam initiated at 1:03 PM.  Appropriate orders placed.  JIAN HODGMAN was informed that the remainder of the evaluation will be completed by another provider, this initial triage assessment does not replace that evaluation, and the importance of remaining in the ED until their evaluation is complete.     Faythe Ghee, PA-C 02/05/23 1304

## 2023-03-05 DIAGNOSIS — F329 Major depressive disorder, single episode, unspecified: Secondary | ICD-10-CM | POA: Diagnosis not present

## 2023-03-17 DIAGNOSIS — F418 Other specified anxiety disorders: Secondary | ICD-10-CM | POA: Diagnosis not present
